# Patient Record
Sex: Female | Born: 1955
Health system: Southern US, Community
[De-identification: ages and names within clinical notes are randomized; demographics above are authoritative.]

## PROBLEM LIST (undated history)

## (undated) DIAGNOSIS — E7800 Pure hypercholesterolemia, unspecified: Secondary | ICD-10-CM

## (undated) DIAGNOSIS — Z860100 Personal history of colon polyps, unspecified: Secondary | ICD-10-CM

## (undated) DIAGNOSIS — K589 Irritable bowel syndrome without diarrhea: Secondary | ICD-10-CM

## (undated) DIAGNOSIS — M549 Dorsalgia, unspecified: Secondary | ICD-10-CM

## (undated) DIAGNOSIS — R42 Dizziness and giddiness: Secondary | ICD-10-CM

## (undated) DIAGNOSIS — K648 Other hemorrhoids: Secondary | ICD-10-CM

## (undated) DIAGNOSIS — L680 Hirsutism: Secondary | ICD-10-CM

## (undated) DIAGNOSIS — K219 Gastro-esophageal reflux disease without esophagitis: Secondary | ICD-10-CM

## (undated) DIAGNOSIS — N200 Calculus of kidney: Secondary | ICD-10-CM

## (undated) DIAGNOSIS — R7989 Other specified abnormal findings of blood chemistry: Secondary | ICD-10-CM

## (undated) DIAGNOSIS — E559 Vitamin D deficiency, unspecified: Secondary | ICD-10-CM

## (undated) DIAGNOSIS — K635 Polyp of colon: Secondary | ICD-10-CM

## (undated) DIAGNOSIS — Z8742 Personal history of other diseases of the female genital tract: Secondary | ICD-10-CM

## (undated) DIAGNOSIS — N281 Cyst of kidney, acquired: Secondary | ICD-10-CM

## (undated) DIAGNOSIS — E079 Disorder of thyroid, unspecified: Secondary | ICD-10-CM

## (undated) DIAGNOSIS — G4730 Sleep apnea, unspecified: Secondary | ICD-10-CM

## (undated) DIAGNOSIS — R946 Abnormal results of thyroid function studies: Secondary | ICD-10-CM

## (undated) DIAGNOSIS — S92909A Unspecified fracture of unspecified foot, initial encounter for closed fracture: Secondary | ICD-10-CM

## (undated) DIAGNOSIS — I8390 Asymptomatic varicose veins of unspecified lower extremity: Secondary | ICD-10-CM

## (undated) DIAGNOSIS — R7309 Other abnormal glucose: Secondary | ICD-10-CM

## (undated) DIAGNOSIS — M1990 Unspecified osteoarthritis, unspecified site: Secondary | ICD-10-CM

## (undated) DIAGNOSIS — R319 Hematuria, unspecified: Secondary | ICD-10-CM

## (undated) DIAGNOSIS — G43909 Migraine, unspecified, not intractable, without status migrainosus: Secondary | ICD-10-CM

## (undated) DIAGNOSIS — Z9109 Other allergy status, other than to drugs and biological substances: Secondary | ICD-10-CM

## (undated) DIAGNOSIS — L719 Rosacea, unspecified: Secondary | ICD-10-CM

## (undated) DIAGNOSIS — K5900 Constipation, unspecified: Secondary | ICD-10-CM

## (undated) DIAGNOSIS — K5790 Diverticulosis of intestine, part unspecified, without perforation or abscess without bleeding: Secondary | ICD-10-CM

## (undated) DIAGNOSIS — E669 Obesity, unspecified: Secondary | ICD-10-CM

## (undated) HISTORY — DX: Other hemorrhoids: K64.8

## (undated) HISTORY — DX: Other allergy status, other than to drugs and biological substances: Z91.09

## (undated) HISTORY — DX: Personal history of other diseases of the female genital tract: Z87.42

## (undated) HISTORY — DX: Diverticulosis of intestine, part unspecified, without perforation or abscess without bleeding: K57.90

## (undated) HISTORY — DX: Personal history of colonic polyps: Z86.010

## (undated) HISTORY — DX: Other abnormal glucose: R73.09

## (undated) HISTORY — DX: Polyp of colon: K63.5

## (undated) HISTORY — DX: Irritable bowel syndrome, unspecified: K58.9

## (undated) HISTORY — PX: HEMATOMA EVACUATION: SHX5118

## (undated) HISTORY — DX: Calculus of kidney: N20.0

## (undated) HISTORY — DX: Rosacea, unspecified: L71.9

## (undated) HISTORY — DX: Migraine, unspecified, not intractable, without status migrainosus: G43.909

## (undated) HISTORY — DX: Constipation, unspecified: K59.00

## (undated) HISTORY — DX: Abnormal results of thyroid function studies: R94.6

## (undated) HISTORY — DX: Asymptomatic varicose veins of unspecified lower extremity: I83.90

## (undated) HISTORY — DX: Gastro-esophageal reflux disease without esophagitis: K21.9

## (undated) HISTORY — DX: Obesity, unspecified: E66.9

## (undated) HISTORY — DX: Dorsalgia, unspecified: M54.9

## (undated) HISTORY — DX: Other specified abnormal findings of blood chemistry: R79.89

## (undated) HISTORY — DX: Dizziness and giddiness: R42

## (undated) HISTORY — DX: Unspecified osteoarthritis, unspecified site: M19.90

## (undated) HISTORY — DX: Sleep apnea, unspecified: G47.30

## (undated) HISTORY — PX: ABDOMINAL HYSTERECTOMY: SHX81

## (undated) HISTORY — DX: Unspecified fracture of unspecified foot, initial encounter for closed fracture: S92.909A

## (undated) HISTORY — DX: Cyst of kidney, acquired: N28.1

## (undated) HISTORY — DX: Vitamin D deficiency, unspecified: E55.9

## (undated) HISTORY — DX: Personal history of colon polyps, unspecified: Z86.0100

## (undated) HISTORY — DX: Hematuria, unspecified: R31.9

## (undated) HISTORY — DX: Pure hypercholesterolemia, unspecified: E78.00

## (undated) HISTORY — DX: Hirsutism: L68.0

---

## 1977-04-25 HISTORY — PX: ABDOMINAL EXPLORATION SURGERY: SHX538

## 1978-04-25 HISTORY — PX: APPENDECTOMY: SHX54

## 1978-04-25 HISTORY — PX: TUBAL LIGATION: SHX77

## 1983-04-26 HISTORY — PX: PARTIAL HYSTERECTOMY: SHX80

## 2004-08-18 ENCOUNTER — Ambulatory Visit: Payer: Self-pay | Admitting: Otolaryngology

## 2006-07-05 ENCOUNTER — Ambulatory Visit: Payer: Self-pay | Admitting: Unknown Physician Specialty

## 2007-12-03 ENCOUNTER — Emergency Department: Payer: Self-pay | Admitting: Emergency Medicine

## 2007-12-03 ENCOUNTER — Other Ambulatory Visit: Payer: Self-pay

## 2009-04-30 ENCOUNTER — Ambulatory Visit: Payer: Self-pay | Admitting: Family Medicine

## 2009-05-15 LAB — HM MAMMOGRAPHY: HM Mammogram: NORMAL

## 2009-06-21 ENCOUNTER — Emergency Department: Payer: Self-pay | Admitting: Emergency Medicine

## 2010-07-22 ENCOUNTER — Ambulatory Visit: Payer: Self-pay | Admitting: Family Medicine

## 2012-02-17 ENCOUNTER — Other Ambulatory Visit: Payer: Self-pay

## 2012-02-17 ENCOUNTER — Telehealth: Payer: Self-pay | Admitting: Radiology

## 2012-02-17 ENCOUNTER — Ambulatory Visit (INDEPENDENT_AMBULATORY_CARE_PROVIDER_SITE_OTHER): Payer: BC Managed Care – PPO | Admitting: Family Medicine

## 2012-02-17 ENCOUNTER — Ambulatory Visit
Admission: RE | Admit: 2012-02-17 | Discharge: 2012-02-17 | Disposition: A | Discharge status: Home / Self Care | Payer: Self-pay | Source: Ambulatory Visit | Attending: Family Medicine | Admitting: Family Medicine

## 2012-02-17 VITALS — BP 112/70 | HR 73 | Temp 98.1°F | Resp 16 | Ht 60.0 in | Wt 145.0 lb

## 2012-02-17 DIAGNOSIS — G43909 Migraine, unspecified, not intractable, without status migrainosus: Secondary | ICD-10-CM

## 2012-02-17 DIAGNOSIS — F411 Generalized anxiety disorder: Secondary | ICD-10-CM

## 2012-02-17 DIAGNOSIS — R51 Headache: Secondary | ICD-10-CM

## 2012-02-17 DIAGNOSIS — R079 Chest pain, unspecified: Secondary | ICD-10-CM

## 2012-02-17 DIAGNOSIS — G4700 Insomnia, unspecified: Secondary | ICD-10-CM

## 2012-02-17 DIAGNOSIS — F419 Anxiety disorder, unspecified: Secondary | ICD-10-CM

## 2012-02-17 DIAGNOSIS — E7800 Pure hypercholesterolemia, unspecified: Secondary | ICD-10-CM

## 2012-02-17 LAB — POCT CBC
Granulocyte percent: 67.1 %G (ref 37–80)
MID (cbc): 0.5 (ref 0–0.9)
MPV: 9.9 fL (ref 0–99.8)
POC MID %: 6.1 %M (ref 0–12)
Platelet Count, POC: 299 10*3/uL (ref 142–424)
RBC: 5.15 M/uL (ref 4.04–5.48)

## 2012-02-17 LAB — POCT SEDIMENTATION RATE: POCT SED RATE: 13 mm/hr (ref 0–22)

## 2012-02-17 MED ORDER — LORAZEPAM 0.5 MG PO TABS: 0.5000 mg | ORAL_TABLET | Freq: Three times a day (TID) | ORAL | Status: DC

## 2012-02-17 MED ORDER — PREDNISONE 20 MG PO TABS: ORAL_TABLET | ORAL | Status: DC

## 2012-02-17 MED ORDER — AMITRIPTYLINE HCL 25 MG PO TABS: 25.0000 mg | ORAL_TABLET | Freq: Every day | ORAL | Status: DC

## 2012-02-17 NOTE — Telephone Encounter (Signed)
Patient could not do the MRI because she is [place], have spoken to Dr Katrinka Blazing, then to patient. The patient advised to go to ER for worsening of headache, dizziness, gait trouble or vomiting. She has Rx for sedation, and she will have her husband drive her for the scan. I have called [facility], to see if they can RS the scan.

## 2012-02-17 NOTE — Telephone Encounter (Signed)
Sorry, error only the MRI was authorized, not the MRA.

## 2012-02-17 NOTE — Telephone Encounter (Signed)
I have been advised she can go Monday at 130, she needs to be there at 12:45.  Is at [facility] location. I have called patient to advise.

## 2012-02-17 NOTE — Progress Notes (Signed)
[address]   [place], [place]  [address]   [phone]  Subjective:    [name], female    DOB: 1955-09-22, 56 y.o.   MRN: [id number]  HPIThis 56 y.o. female presents for evaluation of the following:  1.  Headaches, vertigo:  Diagnosed with vertigo in past; felt secondary to migraine; diagnosed by [facility].  Never evaluated by neurologist or headache specialist; s/p MRI with Dr. Jenne Campus in 2007.  Had another MRI brain in 2009 normal.  Having horrible headaches; applies pressure to forehead; eyes hurt; feels like marble behind R eye.  Getting a little dizziness.  No horrible vertigo with headaches; having some vertigo with laying supine; no horrible vertigo attacks.  12/10 R eye pain.  Headaches way worse.  Deep eye pain.  No pain with moving eyes.  Headache is constant but severity waxes and wanes; intensity worsens with increasing stress an anxiety.  No problems with balance, unsteady gait.  No n/t.  Normal b/b function.  No confusion; no neck pain.  Not sleeping; major stressors since 04/2011.  +jaw pain; no grinding teeth or clenching teeth. No pain with chewing.  Onset of headaches in 11/2011 with mother's behavior worsened with dementia.  Also took Tramadol, Tylenol, ASA without improvement.  Taking ASA 325mg  daily without improvement in headache.  Intermittent nausea; +phonophobia.  Worst headache of life.  2.  Photophobia: can occur at any time; eyes stinging.  Not like sinus headache.  Light sensitivity R eye.  No tearing of eye.  No drainage.  Applying artificial tears without improvement.  Blurred vision with severe eye pain.    3.  Insomnia:  Sleeping only 4-5 hours per night.  Taking Halcion nightly; worked for 3-4 weeks; taking one Halcion at bedtime.   4.  Anxety:  Took husband's Xanax one tablet without any effect.  Took Xanax with Halcion; slept four hours.  Having panic attacks; daughter suggested Ativan which works for daughter.   Father committed suicide in 08/2011.   Mother diagnosed with dementia 04/2011.  Husband had AMI 08/2011.  Husband underwent stenting cardiac in  09/2011.  Mother had CVA in 11/2011.  Husband readmitted to hospital for recurrent chest pain; s/p repeat cardiac catheterization 11/2011.  Son got married in 01/2012; involved in wedding planning.  Additional training for Medicare enrollment and ObamaCare in 01/2012.    5.  Chest pain: occurs sporadically; +chest tightness; non-exertional.  Known hyperlipidemia, obesity, glucose intolerance.     Review of Systems  Constitutional: Positive for fatigue. Negative for fever, chills and diaphoresis.  HENT: Negative for nosebleeds, congestion, rhinorrhea, sneezing and postnasal drip.   Eyes: Positive for photophobia, pain and visual disturbance. Negative for discharge, redness and itching.  Respiratory: Positive for chest tightness. Negative for shortness of breath, wheezing and stridor.   Cardiovascular: Positive for chest pain. Negative for palpitations and leg swelling.  Neurological: Positive for dizziness and headaches. Negative for tremors, seizures, syncope, facial asymmetry, speech difficulty, weakness, light-headedness and numbness.  Hematological: Negative for adenopathy.  Psychiatric/Behavioral: Positive for disturbed wake/sleep cycle. Negative for suicidal ideas, hallucinations, behavioral problems, confusion, self-injury, dysphoric mood, decreased concentration and agitation. The patient is nervous/anxious. The patient is not hyperactive.         Past Medical History  Diagnosis Date  . Allergy   . Anemia   . IBS (irritable bowel syndrome)   . Hyperlipidemia     Past Surgical History  Procedure Date  . Appendectomy   .  Tubal ligation     Prior to Admission medications   Medication Sig Start Date End Date Taking? Authorizing Provider  amitriptyline (ELAVIL) 25 MG tablet Take 1 tablet (25 mg total) by mouth at bedtime. 02/17/12   Ethelda Chick, MD  atorvastatin (LIPITOR) 10 MG  tablet Take 10 mg by mouth daily.   Yes Historical Provider, MD  cholecalciferol (VITAMIN D) 1000 UNITS tablet Take 5,000 Units by mouth daily.   Yes Historical Provider, MD  clidinium-chlordiazePOXIDE (LIBRAX) 2.5-5 MG per capsule Take 1 capsule by mouth 3 (three) times daily as needed.   Yes Historical Provider, MD  fexofenadine (ALLEGRA) 180 MG tablet Take 180 mg by mouth daily.   Yes Historical Provider, MD  fluticasone (VERAMYST) 27.5 MCG/SPRAY nasal spray Place 2 sprays into the nose daily.   Yes Historical Provider, MD  LORazepam (ATIVAN) 0.5 MG tablet Take 1 tablet (0.5 mg total) by mouth every 8 (eight) hours. 02/17/12   Ethelda Chick, MD  traMADol (ULTRAM) 50 MG tablet Take 50 mg by mouth as needed.   Yes Historical Provider, MD  triazolam (HALCION) 0.25 MG tablet Take 0.25 mg by mouth at bedtime as needed.   Yes Historical Provider, MD    No Known Allergies  History   Social History  . Marital Status: Married    Spouse Name: N/A    Number of Children: N/A  . Years of Education: N/A   Occupational History  . Not on file.   Social History Main Topics  . Smoking status: Never Smoker   . Smokeless tobacco: Not on file  . Alcohol Use: Yes  . Drug Use: No  . Sexually Active: Not on file   Other Topics Concern  . Not on file   Social History Narrative  . No narrative on file    Family History  Problem Relation Age of Onset  . Diabetes Mother   . COPD Mother   . Heart disease Mother   . Diabetes Sister   . Alcohol abuse Brother     Objective:   Physical Exam  Constitutional: She is oriented to person, place, and time. She appears well-developed and well-nourished. She appears distressed.       SUNGLASSES IN PLACE; LIGHTS OFF IN EXAMINATION ROOM.    HENT:  Head: Normocephalic and atraumatic.  Right Ear: External ear normal.  Left Ear: External ear normal.  Nose: Nose normal.  Mouth/Throat: Oropharynx is clear and moist.  Eyes: Conjunctivae normal and EOM are  normal. Pupils are equal, round, and reactive to light.  Neck: Normal range of motion. Neck supple. No JVD present. No thyromegaly present.  Cardiovascular: Normal rate, regular rhythm, normal heart sounds and intact distal pulses.  Exam reveals no gallop and no friction rub.   No murmur heard. Pulmonary/Chest: Effort normal and breath sounds normal. No respiratory distress.  Abdominal: Soft. Bowel sounds are normal. She exhibits no distension. There is no tenderness. There is no rebound and no guarding.  Musculoskeletal:       Cervical back: Normal. She exhibits normal range of motion and no tenderness.  Lymphadenopathy:    She has no cervical adenopathy.  Neurological: She is alert and oriented to person, place, and time. She has normal reflexes. No cranial nerve deficit. She exhibits normal muscle tone. Coordination normal.  Skin: Skin is warm and dry. She is not diaphoretic. No erythema.  Psychiatric: She has a normal mood and affect. Her behavior is normal. Judgment and thought content normal.  EKG: NSR; NO ACUTE CHANGES.   Results for orders placed in visit on 02/17/12  POCT CBC      Component Value Range   WBC 7.7  4.6 - 10.2 K/uL   Lymph, poc 2.1  0.6 - 3.4   POC LYMPH PERCENT 26.8  10 - 50 %L   MID (cbc) 0.5  0 - 0.9   POC MID % 6.1  0 - 12 %M   POC Granulocyte 5.2  2 - 6.9   Granulocyte percent 67.1  37 - 80 %G   RBC 5.15  4.04 - 5.48 M/uL   Hemoglobin 15.3  12.2 - 16.2 g/dL   HCT, POC 09.8 (*) 11.9 - 47.9 %   MCV 93.5  80 - 97 fL   MCH, POC 29.7  27 - 31.2 pg   MCHC 31.7 (*) 31.8 - 35.4 g/dL   RDW, POC 14.7     Platelet Count, POC 299  142 - 424 K/uL   MPV 9.9  0 - 99.8 fL        Assessment & Plan:   1. Anxiety  LORazepam (ATIVAN) 0.5 MG tablet  2. Insomnia  LORazepam (ATIVAN) 0.5 MG tablet  3. Migraine  amitriptyline (ELAVIL) 25 MG tablet  4. Chest pain  EKG 12-Lead, Comprehensive metabolic panel  5. Headache  POCT CBC, POCT SEDIMENTATION RATE, Comprehensive  metabolic panel, MR MRA Head/Brain Wo Cm  6.  Photophobia. 7. Hyperlipidemia   1.  Headache:  New.  Worst headache of life for past two months; obtain MRI/MRA to evaluate for aneurysm.  Normal neurological exam. Also obtain ESR to evaluate for temporal arteritis yet young for onset. 2. Migraine Headaches: Chronic with recent worsening headaches. Migraines previously diagnosed by ENT; associated with vertigo.  Obtain MRI brain to rule out secondary pathology.  Start Amitriptyline qhs for prophylaxis.   3.  Chest pain: New. Atypical; normal EKG.  Consistent with stress reaction.  To ED for acute worsening.  If persists after appropriate treatment of anxiety, refer to cardiology. 4.  Anxiety: New. Multiple stressors in past ten months.  Pt declined SSRI; rx for Ativan 0.5mg  bid to tid PRN.  Treat insomnia and expect anxiety to improve.  Close follow-up.   5.  Photophobia:  New. Associated with severe headache.  Likely migraine etiology yet severe at this time.  Recommend evaluation by ophthalmology.  Obtain MRI/MRA brain today. 6. Hyperlipidemia: uncontrolled; tolerating Lipitor without side effects; obtain labs.  Meds ordered this encounter  Medications  . fexofenadine (ALLEGRA) 180 MG tablet    Sig: Take 180 mg by mouth daily.  . cholecalciferol (VITAMIN D) 1000 UNITS tablet    Sig: Take 5,000 Units by mouth daily.  Marland Kitchen atorvastatin (LIPITOR) 10 MG tablet    Sig: Take 10 mg by mouth daily.  Marland Kitchen DISCONTD: triazolam (HALCION) 0.25 MG tablet    Sig: Take 0.25 mg by mouth at bedtime as needed.  . fluticasone (VERAMYST) 27.5 MCG/SPRAY nasal spray    Sig: Place 2 sprays into the nose daily.  . clidinium-chlordiazePOXIDE (LIBRAX) 2.5-5 MG per capsule    Sig: Take 1 capsule by mouth 3 (three) times daily as needed.  . traMADol (ULTRAM) 50 MG tablet    Sig: Take 50 mg by mouth as needed.  Marland Kitchen DISCONTD: LORazepam (ATIVAN) 0.5 MG tablet    Sig: Take 1 tablet (0.5 mg total) by mouth every 8 (eight) hours.      Dispense:  60 tablet    Refill:  1  . DISCONTD: amitriptyline (ELAVIL) 25 MG tablet    Sig: Take 1 tablet (25 mg total) by mouth at bedtime.    Dispense:  30 tablet    Refill:  5  . predniSONE (DELTASONE) 20 MG tablet    Sig: Take 3 today, take 2 on day 2,3,4,5,6, take 1 on day 7,8,9,10,11    Dispense:  18 tablet    Refill:  0

## 2012-02-17 NOTE — Telephone Encounter (Signed)
I spoke to [facility] today about MRI / and MRA since they are both ordered same day, Dr Katrinka Blazing also had to speak to the physician. He authorized both scans the auth # [id number]. I called [facility], to get them done today, they are going to do the scan at [facility]. Location, she is to go now. Thanks. Amy

## 2012-02-17 NOTE — Patient Instructions (Addendum)
Go now/ today for MRI scans, [facility] on [address]. The address is [address]  [place].

## 2012-02-18 LAB — COMPREHENSIVE METABOLIC PANEL
ALT: 32 U/L (ref 0–35)
AST: 20 U/L (ref 0–37)
Albumin: 4.8 g/dL (ref 3.5–5.2)
Calcium: 9.7 mg/dL (ref 8.4–10.5)
Chloride: 107 mEq/L (ref 96–112)
Potassium: 4.3 mEq/L (ref 3.5–5.3)
Total Protein: 7.6 g/dL (ref 6.0–8.3)

## 2012-02-18 LAB — LIPID PANEL: LDL Cholesterol: 118 mg/dL — ABNORMAL HIGH (ref 0–99)

## 2012-02-20 ENCOUNTER — Other Ambulatory Visit: Payer: Self-pay | Admitting: Family Medicine

## 2012-02-20 ENCOUNTER — Ambulatory Visit
Admission: RE | Admit: 2012-02-20 | Discharge: 2012-02-20 | Disposition: A | Discharge status: Home / Self Care | Payer: Self-pay | Source: Ambulatory Visit | Attending: Family Medicine | Admitting: Family Medicine

## 2012-02-20 ENCOUNTER — Ambulatory Visit: Admission: RE | Admit: 2012-02-20 | Payer: Self-pay | Source: Ambulatory Visit

## 2012-02-20 DIAGNOSIS — R51 Headache: Secondary | ICD-10-CM

## 2012-02-21 ENCOUNTER — Telehealth: Payer: Self-pay

## 2012-02-21 NOTE — Telephone Encounter (Signed)
Call --- 1.  Ativan is to only be taken as needed and not scheduled three times daily. If she is needing Ativan three times daily and every day, she really needs to start medication for anxiety disorder such as Lexapro or Prozac.  Has she been taking Ativan two tablets three times daily since she was evaluated last week?  2.  Yes, I recommend evaluation by eye doctor.  3.  She needs follow-up with me in 2-3 weeks at 104.  Have her schedule appointment with me.  KMS

## 2012-02-21 NOTE — Telephone Encounter (Signed)
Pt called to ask what the results of her MRI was that was done yesterday - she was told to call us this morning. I advised pt that the radiology report stated normal results and that if Dr Katrinka Blazing had any further instr's for her we will call her back w/those. Pt stated she has some medication that she was given and will take that. Pt also wanted Dr Katrinka Blazing to know that the 1 tab of Ativan 0.5 mg is not helping and that she has to take two of them TID. Pt requests that a Rx for Ativan 1 mg TID be sent to her pharmacy, [facility] [address].   Pt also wants to ask Dr Katrinka Blazing if she needs to make an appt w/her eye doctor, and if she needs to f/up here. Dr Katrinka Blazing, please advise.

## 2012-02-22 NOTE — Telephone Encounter (Signed)
patient returned call notified and voiced understanding. She is not taking Ativan tid. Only as needed. She was also wanting to know what her lab results were. Please advise.

## 2012-02-22 NOTE — Telephone Encounter (Signed)
Left message for her to call me back. 

## 2012-02-27 NOTE — Telephone Encounter (Signed)
Lab results were called to pt on 02/25/12 - see notes under lab results.

## 2012-03-12 ENCOUNTER — Telehealth: Payer: Self-pay

## 2012-03-12 MED ORDER — LORAZEPAM 1 MG PO TABS: 1.0000 mg | ORAL_TABLET | Freq: Two times a day (BID) | ORAL | Status: DC | PRN

## 2012-03-12 NOTE — Telephone Encounter (Signed)
Ok to call in:  Ativan 1.0mg    1/2 to 1 po bid PRN  #60 2 refills.  KMS

## 2012-03-12 NOTE — Telephone Encounter (Signed)
PT WAS SEEN BY DR Katrinka Blazing AND HAD TO HAVE AN MRI DONE. SHE WOULD LIKE TO SPEAK WITH HER NURSE ABOUT THE VISIT AND ALSO ABOUT CHANGING THE DOSAGE OF HER MEDICATION. PLEASE CALL [phone]

## 2012-03-12 NOTE — Telephone Encounter (Signed)
Called in for her. Called patient to advise.  

## 2012-03-12 NOTE — Telephone Encounter (Signed)
I called patient back, she wants the Halcion increased she has been taking 2 at bedtime, as Dr Katrinka Blazing advised.  She has seen the eye Dr and she has monovision and photophobia, should be better when she gets glasses. She has d/c the Elavil. I have pended this rx, please sign if you want her to stay at this dose.

## 2012-03-13 ENCOUNTER — Telehealth: Payer: Self-pay | Admitting: Radiology

## 2012-03-13 NOTE — Telephone Encounter (Signed)
Please advise on Halcion, I sent message yesterday, patient wanted new Rx for this medication,at a higher dose but when you sent message back to me, you indicated to send in Ativan, so this was done, but patient does not want the Ativan, she wants Halcion at a higher dose. Please clarify.

## 2012-03-14 ENCOUNTER — Telehealth: Payer: Self-pay

## 2012-03-14 NOTE — Telephone Encounter (Signed)
Patient called very upset that her medication is wrong when she picked it up at the pharmacy. She picked up lorazepam instead of triazolam 0.5 mg. She requested to speak to amy or tamara and patient wishes to be contacted today immediately in reagards to medication mixup.

## 2012-03-15 MED ORDER — TRIAZOLAM 0.25 MG PO TABS: 0.5000 mg | ORAL_TABLET | Freq: Every evening | ORAL | Status: DC | PRN

## 2012-03-15 NOTE — Telephone Encounter (Signed)
Call ---my error in sending in Ativan/Lorazepam instead of Halcion.    Lorazepam and Halcion have same exact mechanism of action and are in same class of medications. Thus, I would like her to eventually get off of Halcion completely and only use Lorazepam for anxiety and insomnia.  I would recommend her using Lorazepam for insomnia instead of increasing Halcion dose.   Halcion is not indicated for long-term use; thus, I am reluctant to actually increase the dose.  How is she currently taking Lorezapam on an average week?  Is she needing Halcion nightly?  KMS

## 2012-03-15 NOTE — Telephone Encounter (Signed)
Yes, please call in:  Halcion 0.25mg  two po qhs #60 2 refills. KMS

## 2012-03-15 NOTE — Telephone Encounter (Signed)
New rx for Halcion to be sent to pharmacy.  Ativan does not work for patient and not taking.  Requesting Halcion 0.25mg  two po qhs.  KMS

## 2012-03-15 NOTE — Telephone Encounter (Signed)
Called in Rx and notified pt 

## 2012-03-15 NOTE — Telephone Encounter (Signed)
Dr Katrinka Blazing, pt reports that the Ativan does not work for her at all, no matter what dose she took so she has not been taking any Ativan. Pt reports she has been having to take two of the 0.25 Halcion Qhs to even get a little drowsy and then sleeps about 4 hrs. Pt requests RF of the Halcion for 2 Qhs #60. I discussed recommendation that Halcion not be used long term and advised pt she should RTC to discuss alternatives w/Dr Katrinka Blazing. Pt agreed and I transferred her to 104 to set up appt for January. Dr Katrinka Blazing, can you Rx the 1-2 Halcion Qhs until then?

## 2012-03-16 ENCOUNTER — Telehealth: Payer: Self-pay

## 2012-03-16 NOTE — Telephone Encounter (Signed)
Pt called and reported that after our conversation yesterday concerning that Halcion is not recommended for long term use, she decided to not take any last night. Now she is having withdrawal Sxs including a panic attack last night and now she has nausea and muscle tremors. After speaking w/Sarah and Lanora Manis, instr'd pt to take 1/2 tab now (she has been taking 2 tabs Qhs) and then take her usual 2 tabs tonight. Starting tomorrow she may slowly start tapering if desired and take 1 1/2 tabs for several days, then decrease to 1 tab for several days and then to 1/2 tab for several days. Advised pt to RTC or ER if Sxs of withdrawal don't improve and to come in to see Dr Katrinka Blazing as soon as possible for new plan for her insomnia. Pt will try to come see her on 12/1 when she is back in office. Dr Katrinka Blazing, routing to you for your info.

## 2012-03-19 NOTE — Telephone Encounter (Signed)
Agree with below noted advice.  No further action warranted at this time.  KMS

## 2012-03-31 NOTE — Progress Notes (Signed)
Reviewed and agree.

## 2012-04-25 DIAGNOSIS — K635 Polyp of colon: Secondary | ICD-10-CM

## 2012-04-25 HISTORY — DX: Polyp of colon: K63.5

## 2012-04-25 HISTORY — PX: COLONOSCOPY: SHX174

## 2012-05-14 ENCOUNTER — Ambulatory Visit: Payer: BC Managed Care – PPO | Admitting: Family Medicine

## 2012-05-22 ENCOUNTER — Encounter: Payer: Self-pay | Admitting: *Deleted

## 2012-05-23 ENCOUNTER — Ambulatory Visit: Payer: Self-pay | Admitting: Internal Medicine

## 2012-05-24 ENCOUNTER — Encounter: Payer: Self-pay | Admitting: Internal Medicine

## 2012-05-24 ENCOUNTER — Ambulatory Visit (INDEPENDENT_AMBULATORY_CARE_PROVIDER_SITE_OTHER): Payer: BC Managed Care – PPO | Admitting: Internal Medicine

## 2012-05-24 VITALS — BP 122/70 | HR 85 | Temp 98.5°F | Ht 60.0 in | Wt 154.0 lb

## 2012-05-24 DIAGNOSIS — N200 Calculus of kidney: Secondary | ICD-10-CM

## 2012-05-24 DIAGNOSIS — Z139 Encounter for screening, unspecified: Secondary | ICD-10-CM

## 2012-05-24 DIAGNOSIS — K589 Irritable bowel syndrome without diarrhea: Secondary | ICD-10-CM

## 2012-05-24 DIAGNOSIS — Z86010 Personal history of colonic polyps: Secondary | ICD-10-CM

## 2012-05-24 DIAGNOSIS — G43909 Migraine, unspecified, not intractable, without status migrainosus: Secondary | ICD-10-CM

## 2012-05-24 DIAGNOSIS — E7800 Pure hypercholesterolemia, unspecified: Secondary | ICD-10-CM

## 2012-05-24 DIAGNOSIS — R42 Dizziness and giddiness: Secondary | ICD-10-CM

## 2012-05-24 MED ORDER — ATORVASTATIN CALCIUM 10 MG PO TABS: 10.0000 mg | ORAL_TABLET | Freq: Every day | ORAL | Status: DC

## 2012-05-25 ENCOUNTER — Telehealth: Payer: Self-pay | Admitting: Internal Medicine

## 2012-05-25 ENCOUNTER — Encounter: Payer: Self-pay | Admitting: Internal Medicine

## 2012-05-25 DIAGNOSIS — N200 Calculus of kidney: Secondary | ICD-10-CM | POA: Insufficient documentation

## 2012-05-25 DIAGNOSIS — R42 Dizziness and giddiness: Secondary | ICD-10-CM | POA: Insufficient documentation

## 2012-05-25 DIAGNOSIS — E7800 Pure hypercholesterolemia, unspecified: Secondary | ICD-10-CM | POA: Insufficient documentation

## 2012-05-25 DIAGNOSIS — Z86010 Personal history of colonic polyps: Secondary | ICD-10-CM | POA: Insufficient documentation

## 2012-05-25 DIAGNOSIS — K589 Irritable bowel syndrome without diarrhea: Secondary | ICD-10-CM | POA: Insufficient documentation

## 2012-05-25 DIAGNOSIS — G43909 Migraine, unspecified, not intractable, without status migrainosus: Secondary | ICD-10-CM | POA: Insufficient documentation

## 2012-05-25 NOTE — Assessment & Plan Note (Signed)
Had extensive w/up.  Saw ENT.  S/p Epley maneuvers.  MRI negative. No symptoms currently.

## 2012-05-25 NOTE — Assessment & Plan Note (Signed)
Not an issue for her now.  Follow.  

## 2012-05-25 NOTE — Assessment & Plan Note (Signed)
Worked up by Dr Cope.  Has a stone.  Asymptomatic.  Follow.    
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
## 2012-05-25 NOTE — Assessment & Plan Note (Signed)
On lipitor.  Low cholesterol diet and exercise.  Check lipid panel and liver function.   

## 2012-05-25 NOTE — Assessment & Plan Note (Signed)
Colonoscopy as outlined above.  Will notify me - who to refer.  Due follow up.

## 2012-05-25 NOTE — Telephone Encounter (Signed)
Opened in error

## 2012-05-25 NOTE — Assessment & Plan Note (Signed)
Constipation.  Better.  Follow.

## 2012-05-29 ENCOUNTER — Other Ambulatory Visit: Payer: BC Managed Care – PPO

## 2012-06-13 ENCOUNTER — Ambulatory Visit: Payer: Self-pay | Admitting: Internal Medicine

## 2012-06-18 ENCOUNTER — Ambulatory Visit: Payer: Self-pay | Admitting: Internal Medicine

## 2012-07-05 ENCOUNTER — Encounter: Payer: Self-pay | Admitting: Internal Medicine

## 2012-07-06 ENCOUNTER — Encounter: Payer: Self-pay | Admitting: *Deleted

## 2012-07-25 ENCOUNTER — Other Ambulatory Visit: Payer: BC Managed Care – PPO

## 2012-08-20 ENCOUNTER — Other Ambulatory Visit (INDEPENDENT_AMBULATORY_CARE_PROVIDER_SITE_OTHER): Payer: BC Managed Care – PPO

## 2012-08-20 ENCOUNTER — Telehealth: Payer: Self-pay | Admitting: *Deleted

## 2012-08-20 DIAGNOSIS — E7800 Pure hypercholesterolemia, unspecified: Secondary | ICD-10-CM

## 2012-08-20 DIAGNOSIS — E559 Vitamin D deficiency, unspecified: Secondary | ICD-10-CM

## 2012-08-20 LAB — HEPATIC FUNCTION PANEL
ALT: 36 U/L — ABNORMAL HIGH (ref 0–35)
AST: 28 U/L (ref 0–37)
Albumin: 4.5 g/dL (ref 3.5–5.2)
Alkaline Phosphatase: 68 U/L (ref 39–117)
Bilirubin, Direct: 0.1 mg/dL (ref 0.0–0.3)
Total Protein: 7.8 g/dL (ref 6.0–8.3)

## 2012-08-20 LAB — LIPID PANEL
Cholesterol: 161 mg/dL (ref 0–200)
Triglycerides: 91 mg/dL (ref 0.0–149.0)

## 2012-08-20 NOTE — Addendum Note (Signed)
Addended by: Charm Barges on: 08/20/2012 01:00 PM   Modules accepted: Orders

## 2012-08-20 NOTE — Telephone Encounter (Signed)
Pt came in for labs and was wanting to get done a TSH ?

## 2012-08-20 NOTE — Telephone Encounter (Signed)
I placed order for thyroid function.

## 2012-08-21 ENCOUNTER — Other Ambulatory Visit: Payer: BC Managed Care – PPO

## 2012-08-21 ENCOUNTER — Encounter: Payer: Self-pay | Admitting: *Deleted

## 2012-08-30 ENCOUNTER — Encounter: Payer: BC Managed Care – PPO | Admitting: Internal Medicine

## 2012-09-05 ENCOUNTER — Ambulatory Visit (INDEPENDENT_AMBULATORY_CARE_PROVIDER_SITE_OTHER): Payer: BC Managed Care – PPO | Admitting: Internal Medicine

## 2012-09-05 ENCOUNTER — Encounter: Payer: Self-pay | Admitting: Internal Medicine

## 2012-09-05 VITALS — BP 120/70 | HR 92 | Temp 98.6°F | Ht 60.25 in | Wt 160.2 lb

## 2012-09-05 DIAGNOSIS — R42 Dizziness and giddiness: Secondary | ICD-10-CM

## 2012-09-05 DIAGNOSIS — Z1211 Encounter for screening for malignant neoplasm of colon: Secondary | ICD-10-CM

## 2012-09-05 DIAGNOSIS — E7800 Pure hypercholesterolemia, unspecified: Secondary | ICD-10-CM

## 2012-09-05 DIAGNOSIS — K589 Irritable bowel syndrome without diarrhea: Secondary | ICD-10-CM

## 2012-09-05 DIAGNOSIS — R7989 Other specified abnormal findings of blood chemistry: Secondary | ICD-10-CM

## 2012-09-05 DIAGNOSIS — Z0000 Encounter for general adult medical examination without abnormal findings: Secondary | ICD-10-CM

## 2012-09-05 DIAGNOSIS — N200 Calculus of kidney: Secondary | ICD-10-CM

## 2012-09-05 DIAGNOSIS — N289 Disorder of kidney and ureter, unspecified: Secondary | ICD-10-CM

## 2012-09-05 DIAGNOSIS — Z860100 Personal history of colon polyps, unspecified: Secondary | ICD-10-CM

## 2012-09-05 DIAGNOSIS — N898 Other specified noninflammatory disorders of vagina: Secondary | ICD-10-CM

## 2012-09-05 DIAGNOSIS — M2550 Pain in unspecified joint: Secondary | ICD-10-CM

## 2012-09-05 DIAGNOSIS — N2889 Other specified disorders of kidney and ureter: Secondary | ICD-10-CM

## 2012-09-05 DIAGNOSIS — R079 Chest pain, unspecified: Secondary | ICD-10-CM

## 2012-09-05 DIAGNOSIS — N9489 Other specified conditions associated with female genital organs and menstrual cycle: Secondary | ICD-10-CM

## 2012-09-05 DIAGNOSIS — G43909 Migraine, unspecified, not intractable, without status migrainosus: Secondary | ICD-10-CM

## 2012-09-05 LAB — FOLLICLE STIMULATING HORMONE: FSH: 52.7 m[IU]/mL

## 2012-09-05 LAB — C-REACTIVE PROTEIN: CRP: 0.6 mg/dL (ref 0.5–20.0)

## 2012-09-05 MED ORDER — ESTRADIOL 0.1 MG/GM VA CREA: TOPICAL_CREAM | VAGINAL | Status: DC

## 2012-09-07 ENCOUNTER — Telehealth: Payer: Self-pay | Admitting: *Deleted

## 2012-09-07 ENCOUNTER — Encounter: Payer: Self-pay | Admitting: Internal Medicine

## 2012-09-07 NOTE — Telephone Encounter (Signed)
Left detailed v/m with Dr Lorin Picket response to Topamax

## 2012-09-07 NOTE — Telephone Encounter (Signed)
Pt aware of lab results, & wanted to know about starting the Topamax (migraine medicine) as discussed at last visit. Please advise

## 2012-09-07 NOTE — Telephone Encounter (Signed)
Given her history of kidney stones, I would like to hold on topamax.  Given the daily nature and negative MRI with no clear answer- I would like for her to refer her to neurology for evaluation and treatment recommendations.  Let me know and I will place order for referral.

## 2012-09-09 ENCOUNTER — Encounter: Payer: Self-pay | Admitting: Internal Medicine

## 2012-09-10 ENCOUNTER — Encounter: Payer: Self-pay | Admitting: Internal Medicine

## 2012-09-10 DIAGNOSIS — N2889 Other specified disorders of kidney and ureter: Secondary | ICD-10-CM | POA: Insufficient documentation

## 2012-09-10 NOTE — Assessment & Plan Note (Signed)
Constipation.  Better.  Follow.

## 2012-09-10 NOTE — Assessment & Plan Note (Signed)
Had extensive w/up.  Saw ENT.  S/p Epley maneuvers.  MRI negative.

## 2012-09-10 NOTE — Assessment & Plan Note (Signed)
Headache as outlined.  Treat allergies.  Check ESR.  Discussed eye exam.  May need neurology referral.  MRI previously - reported negative.  Would hold starting topamax given history of kidney stones.  Follow.
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
## 2012-09-10 NOTE — Assessment & Plan Note (Signed)
Followed by Dr Achilles Dunk.  Obtain records.

## 2012-09-10 NOTE — Assessment & Plan Note (Signed)
Worked up by Dr Cope.  Has a stone.  Asymptomatic.  Follow.    

## 2012-09-10 NOTE — Assessment & Plan Note (Signed)
On lipitor.  Low cholesterol diet and exercise.   Follow lipid panel and liver function.   

## 2012-09-10 NOTE — Assessment & Plan Note (Signed)
Colonoscopy as outlined above.  Due follow up colonoscopy.  Refer to GI.  Cardiac evaluation first.

## 2012-09-27 ENCOUNTER — Other Ambulatory Visit: Payer: BC Managed Care – PPO | Admitting: Cardiovascular Disease

## 2012-09-27 ENCOUNTER — Other Ambulatory Visit (INDEPENDENT_AMBULATORY_CARE_PROVIDER_SITE_OTHER): Payer: BC Managed Care – PPO

## 2012-09-27 DIAGNOSIS — R079 Chest pain, unspecified: Secondary | ICD-10-CM

## 2012-10-02 ENCOUNTER — Encounter: Payer: Self-pay | Admitting: Cardiovascular Disease

## 2012-10-02 ENCOUNTER — Telehealth: Payer: Self-pay | Admitting: *Deleted

## 2012-10-02 NOTE — Telephone Encounter (Signed)
Patient calling requesting results from stress echo.

## 2012-10-02 NOTE — Telephone Encounter (Signed)
Pt notified of stress test results (negative) via my chart.

## 2012-10-02 NOTE — Telephone Encounter (Signed)
See below

## 2012-10-23 ENCOUNTER — Ambulatory Visit: Payer: BC Managed Care – PPO | Admitting: Internal Medicine

## 2012-12-18 ENCOUNTER — Other Ambulatory Visit: Payer: Self-pay | Admitting: *Deleted

## 2012-12-18 MED ORDER — ATORVASTATIN CALCIUM 10 MG PO TABS: 10.0000 mg | ORAL_TABLET | Freq: Every day | ORAL | Status: DC

## 2012-12-27 ENCOUNTER — Other Ambulatory Visit: Payer: Self-pay | Admitting: Internal Medicine

## 2012-12-27 NOTE — Telephone Encounter (Signed)
Ok to refill? Last visit 09/05/12.

## 2012-12-28 NOTE — Telephone Encounter (Signed)
Please clarify with pt how often she takes this medications.  Then can decide about number needed for refills.

## 2012-12-28 NOTE — Telephone Encounter (Signed)
Usually gets a 90 day & takes them TID when you are having a spell

## 2012-12-29 NOTE — Telephone Encounter (Signed)
Refilled librax #90 with no refills.

## 2013-01-19 ENCOUNTER — Ambulatory Visit: Payer: Self-pay | Admitting: Internal Medicine

## 2013-01-19 ENCOUNTER — Other Ambulatory Visit: Payer: Self-pay | Admitting: Internal Medicine

## 2013-01-19 LAB — URINALYSIS, COMPLETE
Glucose,UR: NEGATIVE mg/dL (ref 0–75)
Ketone: NEGATIVE
Nitrite: NEGATIVE

## 2013-01-21 LAB — URINE CULTURE

## 2013-01-21 NOTE — Telephone Encounter (Signed)
She needs a 30 minute appt

## 2013-01-21 NOTE — Telephone Encounter (Signed)
Okay to refill? Cancelled last appt on 10/23/12

## 2013-01-21 NOTE — Telephone Encounter (Signed)
Pt states that the current medication if working just fine and has been for many many years. I offered her an appt tomorrow, she could not come then. I informed patient that we will fill it this time but she will need an appt before she runs out.

## 2013-01-21 NOTE — Telephone Encounter (Signed)
I am ok to refill this x 1, but she needs a f/u with me.  Needs a regular f/u and I would like to discuss with her regarding possibly another medication to help with stress.  I can se her tomorrow at 2:45 if can come in.  Let me know if a problem

## 2013-01-22 ENCOUNTER — Ambulatory Visit: Payer: Self-pay | Admitting: Urology

## 2013-01-23 DIAGNOSIS — N23 Unspecified renal colic: Secondary | ICD-10-CM | POA: Insufficient documentation

## 2013-02-02 ENCOUNTER — Other Ambulatory Visit: Payer: Self-pay | Admitting: Internal Medicine

## 2013-02-10 ENCOUNTER — Other Ambulatory Visit: Payer: Self-pay | Admitting: Internal Medicine

## 2013-02-28 ENCOUNTER — Other Ambulatory Visit: Payer: Self-pay

## 2013-04-16 ENCOUNTER — Other Ambulatory Visit: Payer: Self-pay | Admitting: *Deleted

## 2013-04-16 MED ORDER — HYDROCORTISONE ACETATE 25 MG RE SUPP: 25.0000 mg | Freq: Two times a day (BID) | RECTAL | Status: DC

## 2013-04-24 ENCOUNTER — Other Ambulatory Visit: Payer: Self-pay | Admitting: *Deleted

## 2013-04-24 NOTE — Telephone Encounter (Signed)
How often does she use the suppositories.  Not sure how to write a 3 month supply for suppositories (because she should not be needing everyday).

## 2013-04-24 NOTE — Telephone Encounter (Signed)
Pt requests 3 month supply hydrocortisone supp?

## 2013-04-24 NOTE — Telephone Encounter (Signed)
Pharmacy Note:  Hydrocortisone AC 25 mg SUPP  Pt is request 58-months supply

## 2013-05-06 ENCOUNTER — Encounter: Payer: Self-pay | Admitting: *Deleted

## 2013-05-06 NOTE — Telephone Encounter (Signed)
Sent mychart message

## 2013-05-17 NOTE — Telephone Encounter (Signed)
Pt read mychart message & never responded (I've denied the medication assuming it is no longer needed)

## 2013-09-17 ENCOUNTER — Encounter: Payer: Self-pay | Admitting: Internal Medicine

## 2013-09-23 ENCOUNTER — Telehealth: Payer: Self-pay | Admitting: *Deleted

## 2013-09-23 ENCOUNTER — Other Ambulatory Visit: Payer: Self-pay | Admitting: Internal Medicine

## 2013-09-23 ENCOUNTER — Ambulatory Visit (INDEPENDENT_AMBULATORY_CARE_PROVIDER_SITE_OTHER): Payer: BC Managed Care – PPO | Admitting: Internal Medicine

## 2013-09-23 ENCOUNTER — Encounter: Payer: Self-pay | Admitting: Internal Medicine

## 2013-09-23 VITALS — BP 102/68 | HR 87 | Resp 16 | Ht 60.25 in | Wt 165.5 lb

## 2013-09-23 DIAGNOSIS — E7800 Pure hypercholesterolemia, unspecified: Secondary | ICD-10-CM

## 2013-09-23 DIAGNOSIS — N200 Calculus of kidney: Secondary | ICD-10-CM

## 2013-09-23 DIAGNOSIS — Z86010 Personal history of colonic polyps: Secondary | ICD-10-CM

## 2013-09-23 DIAGNOSIS — Z733 Stress, not elsewhere classified: Secondary | ICD-10-CM

## 2013-09-23 DIAGNOSIS — R42 Dizziness and giddiness: Secondary | ICD-10-CM

## 2013-09-23 DIAGNOSIS — F439 Reaction to severe stress, unspecified: Secondary | ICD-10-CM

## 2013-09-23 DIAGNOSIS — Z658 Other specified problems related to psychosocial circumstances: Secondary | ICD-10-CM | POA: Insufficient documentation

## 2013-09-23 DIAGNOSIS — N289 Disorder of kidney and ureter, unspecified: Secondary | ICD-10-CM

## 2013-09-23 DIAGNOSIS — K589 Irritable bowel syndrome without diarrhea: Secondary | ICD-10-CM

## 2013-09-23 DIAGNOSIS — R109 Unspecified abdominal pain: Secondary | ICD-10-CM

## 2013-09-23 DIAGNOSIS — K219 Gastro-esophageal reflux disease without esophagitis: Secondary | ICD-10-CM

## 2013-09-23 DIAGNOSIS — N2889 Other specified disorders of kidney and ureter: Secondary | ICD-10-CM

## 2013-09-23 DIAGNOSIS — G43909 Migraine, unspecified, not intractable, without status migrainosus: Secondary | ICD-10-CM

## 2013-09-23 MED ORDER — TRAZODONE HCL 50 MG PO TABS: 25.0000 mg | ORAL_TABLET | Freq: Every evening | ORAL | Status: DC | PRN
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
## 2013-09-23 NOTE — Assessment & Plan Note (Signed)
Has tried PPIs.  Did not help.  Gaviscon helps.  Already has an appt with GI scheduled for 10/03/13.  States due colonoscopy.  Consider EGD if persistent problems.

## 2013-09-23 NOTE — Assessment & Plan Note (Signed)
Followed by Dr Cope.  

## 2013-09-23 NOTE — Progress Notes (Signed)
Pre visit review using our clinic review tool, if applicable. No additional management support is needed unless otherwise documented below in the visit note. 

## 2013-09-23 NOTE — Assessment & Plan Note (Signed)
Worked up by Dr [name].  Has a stone.  Asymptomatic.  Follow.

## 2013-09-23 NOTE — Assessment & Plan Note (Signed)
Has IBS and spastic colon.  Takes Librax prn.

## 2013-09-23 NOTE — Telephone Encounter (Signed)
Orders placed for labs

## 2013-09-23 NOTE — Assessment & Plan Note (Signed)
Previous vertigo.  Has seen ENT.  S/p Epley maneuvers.  Recent flare.  Resolved now.  Follow.

## 2013-09-23 NOTE — Assessment & Plan Note (Signed)
On lipitor.  Low cholesterol diet and exercise.   Follow lipid panel and liver function.   

## 2013-09-23 NOTE — Assessment & Plan Note (Signed)
Increased stress with difficulty sleeping.  Treat with trazodone 50mg  as directed.  Follow closely.  Get her back in soon to reassess.

## 2013-09-23 NOTE — Assessment & Plan Note (Signed)
Pt with left side abdominal pain.  Constant.  Increased intensity at times.  Some decrease in pressure with a good bowel movement.  No triggers.  Increased pain on exam.  Check CT abdomen and pelvis.  Further w/up pending results.  Keep appt with GI.

## 2013-09-23 NOTE — Assessment & Plan Note (Signed)
Due colonoscopy.  Is scheduled to follow up in [place] for f/u colonoscopy.

## 2013-09-23 NOTE — Telephone Encounter (Signed)
Pt is coming in tomorrow what labs and dx?  

## 2013-09-23 NOTE — Assessment & Plan Note (Signed)
MRI previously - reported negative.  No problems with headaches reported today.

## 2013-09-24 ENCOUNTER — Other Ambulatory Visit (INDEPENDENT_AMBULATORY_CARE_PROVIDER_SITE_OTHER): Payer: BC Managed Care – PPO

## 2013-09-24 DIAGNOSIS — R42 Dizziness and giddiness: Secondary | ICD-10-CM

## 2013-09-24 DIAGNOSIS — R109 Unspecified abdominal pain: Secondary | ICD-10-CM

## 2013-09-24 DIAGNOSIS — N200 Calculus of kidney: Secondary | ICD-10-CM

## 2013-09-24 DIAGNOSIS — E7800 Pure hypercholesterolemia, unspecified: Secondary | ICD-10-CM

## 2013-09-24 LAB — CBC WITH DIFFERENTIAL/PLATELET
Basophils Absolute: 0 10*3/uL (ref 0.0–0.1)
Basophils Relative: 0.7 % (ref 0.0–3.0)
EOS ABS: 0.2 10*3/uL (ref 0.0–0.7)
Eosinophils Relative: 2.9 % (ref 0.0–5.0)
HEMATOCRIT: 42.6 % (ref 36.0–46.0)
Hemoglobin: 14.4 g/dL (ref 12.0–15.0)
LYMPHS ABS: 2 10*3/uL (ref 0.7–4.0)
Lymphocytes Relative: 34.8 % (ref 12.0–46.0)
MCHC: 33.9 g/dL (ref 30.0–36.0)
MCV: 89 fl (ref 78.0–100.0)
MONO ABS: 0.4 10*3/uL (ref 0.1–1.0)
Monocytes Relative: 7.2 % (ref 3.0–12.0)
NEUTROS PCT: 54.4 % (ref 43.0–77.0)
Neutro Abs: 3.1 10*3/uL (ref 1.4–7.7)
PLATELETS: 244 10*3/uL (ref 150.0–400.0)
RBC: 4.79 Mil/uL (ref 3.87–5.11)
RDW: 12.7 % (ref 11.5–15.5)
WBC: 5.7 10*3/uL (ref 4.0–10.5)

## 2013-09-24 LAB — LIPID PANEL
CHOLESTEROL: 161 mg/dL (ref 0–200)
HDL: 41.2 mg/dL (ref >39.00)
LDL CALC: 87 mg/dL (ref 0–99)
TRIGLYCERIDES: 163 mg/dL — AB (ref 0.0–149.0)
Total CHOL/HDL Ratio: 4
VLDL: 32.6 mg/dL (ref 0.0–40.0)

## 2013-09-24 LAB — URINALYSIS, ROUTINE W REFLEX MICROSCOPIC
Bilirubin Urine: NEGATIVE
HGB URINE DIPSTICK: NEGATIVE
Ketones, ur: NEGATIVE
Leukocytes, UA: NEGATIVE
Nitrite: NEGATIVE
RBC / HPF: NONE SEEN (ref 0–?)
Specific Gravity, Urine: >=1.03 — AB (ref 1.000–1.030)
Total Protein, Urine: NEGATIVE
UROBILINOGEN UA: 0.2 (ref 0.0–1.0)
Urine Glucose: NEGATIVE
pH: 6 (ref 5.0–8.0)

## 2013-09-24 LAB — BASIC METABOLIC PANEL
BUN: 15 mg/dL (ref 6–23)
CHLORIDE: 107 meq/L (ref 96–112)
CO2: 24 meq/L (ref 19–32)
Calcium: 9.3 mg/dL (ref 8.4–10.5)
Creatinine, Ser: 0.9 mg/dL (ref 0.4–1.2)
GFR: 71.39 mL/min (ref >60.00)
Glucose, Bld: 105 mg/dL — ABNORMAL HIGH (ref 70–99)
Potassium: 3.8 mEq/L (ref 3.5–5.1)
SODIUM: 140 meq/L (ref 135–145)

## 2013-09-24 LAB — HEPATIC FUNCTION PANEL
ALT: 40 U/L — ABNORMAL HIGH (ref 0–35)
AST: 28 U/L (ref 0–37)
Albumin: 4.3 g/dL (ref 3.5–5.2)
Alkaline Phosphatase: 66 U/L (ref 39–117)
Bilirubin, Direct: 0.1 mg/dL (ref 0.0–0.3)
TOTAL PROTEIN: 6.9 g/dL (ref 6.0–8.3)
Total Bilirubin: 0.9 mg/dL (ref 0.2–1.2)

## 2013-09-24 LAB — AMYLASE: AMYLASE: 42 U/L (ref 27–131)

## 2013-09-24 LAB — TSH: TSH: 5.25 u[IU]/mL — ABNORMAL HIGH (ref 0.35–4.50)

## 2013-09-24 LAB — LIPASE: LIPASE: 18 U/L (ref 11.0–59.0)

## 2013-09-24 MED ORDER — HYDROCORTISONE ACETATE 25 MG RE SUPP: 25.0000 mg | Freq: Two times a day (BID) | RECTAL | Status: DC

## 2013-09-24 MED ORDER — CILIDINIUM-CHLORDIAZEPOXIDE 2.5-5 MG PO CAPS: 1.0000 | ORAL_CAPSULE | Freq: Two times a day (BID) | ORAL | Status: DC | PRN

## 2013-09-24 NOTE — Telephone Encounter (Signed)
Refilled librax #60 with no refills and suppositories #12 with no refills.

## 2013-09-24 NOTE — Telephone Encounter (Signed)
Ok refill? 

## 2013-09-25 ENCOUNTER — Encounter: Payer: Self-pay | Admitting: Internal Medicine

## 2013-09-25 NOTE — Telephone Encounter (Signed)
Rx faxed to pharmacy  

## 2013-09-27 ENCOUNTER — Encounter (HOSPITAL_BASED_OUTPATIENT_CLINIC_OR_DEPARTMENT_OTHER): Payer: Self-pay | Admitting: Emergency Medicine

## 2013-09-27 ENCOUNTER — Emergency Department (HOSPITAL_BASED_OUTPATIENT_CLINIC_OR_DEPARTMENT_OTHER): Payer: BC Managed Care – PPO

## 2013-09-27 ENCOUNTER — Emergency Department (HOSPITAL_BASED_OUTPATIENT_CLINIC_OR_DEPARTMENT_OTHER)
Admission: EM | Admit: 2013-09-27 | Discharge: 2013-09-27 | Disposition: A | Discharge status: Home / Self Care | Payer: BC Managed Care – PPO | Attending: Emergency Medicine | Admitting: Emergency Medicine

## 2013-09-27 DIAGNOSIS — Z9851 Tubal ligation status: Secondary | ICD-10-CM | POA: Insufficient documentation

## 2013-09-27 DIAGNOSIS — Z8742 Personal history of other diseases of the female genital tract: Secondary | ICD-10-CM | POA: Insufficient documentation

## 2013-09-27 DIAGNOSIS — Z9889 Other specified postprocedural states: Secondary | ICD-10-CM | POA: Insufficient documentation

## 2013-09-27 DIAGNOSIS — E669 Obesity, unspecified: Secondary | ICD-10-CM | POA: Insufficient documentation

## 2013-09-27 DIAGNOSIS — Z7982 Long term (current) use of aspirin: Secondary | ICD-10-CM | POA: Insufficient documentation

## 2013-09-27 DIAGNOSIS — E7800 Pure hypercholesterolemia, unspecified: Secondary | ICD-10-CM | POA: Insufficient documentation

## 2013-09-27 DIAGNOSIS — Z8781 Personal history of (healed) traumatic fracture: Secondary | ICD-10-CM | POA: Insufficient documentation

## 2013-09-27 DIAGNOSIS — Z860100 Personal history of colon polyps, unspecified: Secondary | ICD-10-CM | POA: Insufficient documentation

## 2013-09-27 DIAGNOSIS — IMO0002 Reserved for concepts with insufficient information to code with codable children: Secondary | ICD-10-CM | POA: Insufficient documentation

## 2013-09-27 DIAGNOSIS — M1990 Unspecified osteoarthritis, unspecified site: Secondary | ICD-10-CM | POA: Insufficient documentation

## 2013-09-27 DIAGNOSIS — K5790 Diverticulosis of intestine, part unspecified, without perforation or abscess without bleeding: Secondary | ICD-10-CM

## 2013-09-27 DIAGNOSIS — K5730 Diverticulosis of large intestine without perforation or abscess without bleeding: Secondary | ICD-10-CM | POA: Insufficient documentation

## 2013-09-27 DIAGNOSIS — Z8679 Personal history of other diseases of the circulatory system: Secondary | ICD-10-CM | POA: Insufficient documentation

## 2013-09-27 DIAGNOSIS — Z791 Long term (current) use of non-steroidal anti-inflammatories (NSAID): Secondary | ICD-10-CM | POA: Insufficient documentation

## 2013-09-27 DIAGNOSIS — R109 Unspecified abdominal pain: Secondary | ICD-10-CM

## 2013-09-27 DIAGNOSIS — Z872 Personal history of diseases of the skin and subcutaneous tissue: Secondary | ICD-10-CM | POA: Insufficient documentation

## 2013-09-27 DIAGNOSIS — Z90710 Acquired absence of both cervix and uterus: Secondary | ICD-10-CM | POA: Insufficient documentation

## 2013-09-27 DIAGNOSIS — Z87448 Personal history of other diseases of urinary system: Secondary | ICD-10-CM | POA: Insufficient documentation

## 2013-09-27 DIAGNOSIS — E559 Vitamin D deficiency, unspecified: Secondary | ICD-10-CM | POA: Insufficient documentation

## 2013-09-27 DIAGNOSIS — Z87442 Personal history of urinary calculi: Secondary | ICD-10-CM | POA: Insufficient documentation

## 2013-09-27 DIAGNOSIS — G43909 Migraine, unspecified, not intractable, without status migrainosus: Secondary | ICD-10-CM | POA: Insufficient documentation

## 2013-09-27 HISTORY — DX: Disorder of thyroid, unspecified: E07.9

## 2013-09-27 LAB — COMPREHENSIVE METABOLIC PANEL
ALT: 38 U/L — AB (ref 0–35)
AST: 27 U/L (ref 0–37)
Albumin: 4.6 g/dL (ref 3.5–5.2)
Alkaline Phosphatase: 76 U/L (ref 39–117)
BUN: 16 mg/dL (ref 6–23)
CO2: 24 meq/L (ref 19–32)
CREATININE: 0.9 mg/dL (ref 0.50–1.10)
Calcium: 9.6 mg/dL (ref 8.4–10.5)
Chloride: 105 mEq/L (ref 96–112)
GFR calc Af Amer: 81 mL/min — ABNORMAL LOW (ref >90)
GFR, EST NON AFRICAN AMERICAN: 70 mL/min — AB (ref >90)
GLUCOSE: 111 mg/dL — AB (ref 70–99)
Potassium: 4.3 mEq/L (ref 3.7–5.3)
SODIUM: 143 meq/L (ref 137–147)
Total Bilirubin: 0.5 mg/dL (ref 0.3–1.2)
Total Protein: 7.7 g/dL (ref 6.0–8.3)

## 2013-09-27 LAB — CBC WITH DIFFERENTIAL/PLATELET
Basophils Absolute: 0 10*3/uL (ref 0.0–0.1)
Basophils Relative: 0 % (ref 0–1)
EOS PCT: 3 % (ref 0–5)
Eosinophils Absolute: 0.2 10*3/uL (ref 0.0–0.7)
HCT: 42.9 % (ref 36.0–46.0)
Hemoglobin: 15.2 g/dL — ABNORMAL HIGH (ref 12.0–15.0)
LYMPHS ABS: 2.1 10*3/uL (ref 0.7–4.0)
LYMPHS PCT: 36 % (ref 12–46)
MCH: 31 pg (ref 26.0–34.0)
MCHC: 35.4 g/dL (ref 30.0–36.0)
MCV: 87.6 fL (ref 78.0–100.0)
MONO ABS: 0.5 10*3/uL (ref 0.1–1.0)
Monocytes Relative: 8 % (ref 3–12)
Neutro Abs: 3.1 10*3/uL (ref 1.7–7.7)
Neutrophils Relative %: 52 % (ref 43–77)
Platelets: 230 10*3/uL (ref 150–400)
RBC: 4.9 MIL/uL (ref 3.87–5.11)
RDW: 12.3 % (ref 11.5–15.5)
WBC: 5.9 10*3/uL (ref 4.0–10.5)

## 2013-09-27 LAB — URINALYSIS, ROUTINE W REFLEX MICROSCOPIC
Bilirubin Urine: NEGATIVE
Glucose, UA: NEGATIVE mg/dL
HGB URINE DIPSTICK: NEGATIVE
KETONES UR: NEGATIVE mg/dL
Nitrite: NEGATIVE
PROTEIN: NEGATIVE mg/dL
Specific Gravity, Urine: 1.01 (ref 1.005–1.030)
Urobilinogen, UA: 0.2 mg/dL (ref 0.0–1.0)
pH: 6 (ref 5.0–8.0)

## 2013-09-27 LAB — URINE MICROSCOPIC-ADD ON

## 2013-09-27 LAB — LIPASE, BLOOD: Lipase: 29 U/L (ref 11–59)

## 2013-09-27 MED ORDER — IOHEXOL 300 MG/ML  SOLN
100.0000 mL | Freq: Once | INTRAMUSCULAR | Status: AC | PRN
  Administered 2013-09-27: 100 mL via INTRAVENOUS

## 2013-09-27 MED ORDER — SODIUM CHLORIDE 0.9 % IV BOLUS (SEPSIS)
1000.0000 mL | Freq: Once | INTRAVENOUS | Status: AC
  Administered 2013-09-27: 1000 mL via INTRAVENOUS

## 2013-09-27 MED ORDER — ONDANSETRON HCL 4 MG/2ML IJ SOLN
4.0000 mg | Freq: Once | INTRAMUSCULAR | Status: AC
  Administered 2013-09-27: 4 mg via INTRAVENOUS
  Filled 2013-09-27: qty 2

## 2013-09-27 MED ORDER — IOHEXOL 300 MG/ML  SOLN
50.0000 mL | Freq: Once | INTRAMUSCULAR | Status: AC | PRN
  Administered 2013-09-27: 50 mL via ORAL

## 2013-09-27 MED ORDER — OXYCODONE-ACETAMINOPHEN 5-325 MG PO TABS: 2.0000 | ORAL_TABLET | ORAL | Status: DC | PRN

## 2013-09-27 MED ORDER — ONDANSETRON 8 MG PO TBDP: 8.0000 mg | ORAL_TABLET | Freq: Three times a day (TID) | ORAL | Status: DC | PRN

## 2013-09-27 MED ORDER — CIPROFLOXACIN HCL 500 MG PO TABS: 500.0000 mg | ORAL_TABLET | Freq: Two times a day (BID) | ORAL | Status: DC

## 2013-09-27 MED ORDER — HYDROMORPHONE HCL PF 1 MG/ML IJ SOLN
1.0000 mg | Freq: Once | INTRAMUSCULAR | Status: AC
  Administered 2013-09-27: 1 mg via INTRAVENOUS
  Filled 2013-09-27: qty 1

## 2013-09-27 MED ORDER — METRONIDAZOLE 500 MG PO TABS: 500.0000 mg | ORAL_TABLET | Freq: Two times a day (BID) | ORAL | Status: DC

## 2013-09-27 NOTE — Discharge Instructions (Signed)
Abdominal Pain, Adult Many things can cause belly (abdominal) pain. Most times, the belly pain is not dangerous. Many cases of belly pain can be watched and treated at home. HOME CARE   Do not take medicines that help you go poop (laxatives) unless told to by your doctor.  Only take medicine as told by your doctor.  Eat or drink as told by your doctor. Your doctor will tell you if you should be on a special diet. GET HELP IF:  You do not know what is causing your belly pain.  You have belly pain while you are sick to your stomach (nauseous) or have runny poop (diarrhea).  You have pain while you pee or poop.  Your belly pain wakes you up at night.  You have belly pain that gets worse or better when you eat.  You have belly pain that gets worse when you eat fatty foods. GET HELP RIGHT AWAY IF:   The pain does not go away within 2 hours.  You have a fever.  You keep throwing up (vomiting).  The pain changes and is only in the right or left part of the belly.  You have bloody or tarry looking poop. MAKE SURE YOU:   Understand these instructions.  Will watch your condition.  Will get help right away if you are not doing well or get worse. Document Released: 09/28/2007 Document Revised: 01/30/2013 Document Reviewed: 12/19/2012 [facility] Patient Information 2014 [place]. Diverticulitis Small pockets or "bubbles" can develop in the wall of the intestine. Diverticulitis is when those pockets become infected and inflamed. This causes stomach pain (usually on the left side). HOME CARE  Take all medicine as told by your doctor.  Try a clear liquid diet (broth, tea, or water) for as long as told by your doctor.  Keep all follow-up visits with your doctor.  You may be put on a low-fiber diet once you start feeling better. Here are foods that have low-fiber:  White breads, cereals, rice, and pasta.  Cooked fruits and vegetables or soft fresh fruits and vegetables  without the skin.  Ground or well-cooked tender beef, ham, veal, lamb, pork, or poultry.  Eggs and seafood.  After you are doing well on the low-fiber diet, you may be put on a high-fiber diet. Here are ways to increase your fiber:  Choose whole-grain breads, cereals, pasta, and brown rice.  Choose fruits and vegetables with skin on. Do not overcook the vegetables.  Choose nuts, seeds, legumes, dried peas, beans, and lentils.  Look for food products that have more than 3 grams of fiber per serving on the food label. GET HELP RIGHT AWAY IF:  Your pain does not get better or gets worse.  You have trouble eating food.  You are not pooping (having bowel movements) like normal.  You have a temperature by mouth above 102 F (38.9 C), not controlled by medicine.  You keep throwing up (vomiting).  You have bloody or black, tarry poop (stools).  You are getting worse and not better. MAKE SURE YOU:   Understand these instructions.  Will watch your condition.  Will get help right away if you are not doing well or get worse. Document Released: 09/28/2007 Document Revised: 07/04/2011 Document Reviewed: 03/02/2009 [facility] Patient Information 2014 [place], [place].
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
## 2013-09-27 NOTE — ED Notes (Signed)
Patient states she has had left upper abdominal pain for the last one week.  States when the pain began, it woke her up in the middle of the night.  States she has had diarrhea x 2, one week ago and two days ago.  Describes the pain as a gnawing constant pain that varies in intensity. States she has a history of IBS and diverticulitis.  Symptoms are associated with a decreased appetite and mild nausea.

## 2013-10-03 MED ORDER — TRAZODONE HCL 50 MG PO TABS: ORAL_TABLET | ORAL | Status: DC

## 2013-10-03 NOTE — Telephone Encounter (Signed)
rx sent in to pharmacy for trazodone 50mg  1 1/2 tablet q hs #45 with one refill.  Pt had sent my chart with info on 50mg  one tablet qhs.  Needed to increase dose.

## 2013-10-30 ENCOUNTER — Encounter: Payer: Self-pay | Admitting: Internal Medicine

## 2013-10-30 ENCOUNTER — Telehealth: Payer: Self-pay | Admitting: Internal Medicine

## 2013-10-30 MED ORDER — TRAZODONE HCL 100 MG PO TABS: 100.0000 mg | ORAL_TABLET | Freq: Every day | ORAL | Status: DC

## 2013-10-30 NOTE — Telephone Encounter (Signed)
Per my chart message, pt doing well sleeping with 100mg  of trazodone.  Sent in rx for trazodone 100mg  #90 with no refills.

## 2014-01-02 ENCOUNTER — Encounter: Payer: BC Managed Care – PPO | Admitting: Internal Medicine

## 2014-01-27 ENCOUNTER — Other Ambulatory Visit: Payer: Self-pay | Admitting: Internal Medicine

## 2014-01-27 NOTE — Telephone Encounter (Signed)
Pt cancelled appt on 01/02/14. Please advise if okay to refill

## 2014-01-27 NOTE — Telephone Encounter (Signed)
Notify her that I will ok a refill x 1, but she needs a physical scheduled with me asap.  Needs to keep this appt.  Will not continue to refill.

## 2014-01-28 ENCOUNTER — Encounter: Payer: Self-pay | Admitting: *Deleted

## 2014-01-28 NOTE — Telephone Encounter (Signed)
Refilled Rx & notified pt via mychart. Also notified her to schedule a physical asap BEFORE she gets close to running out.

## 2014-01-31 NOTE — Telephone Encounter (Signed)
Unread mychart message mailed to patient 

## 2014-02-19 ENCOUNTER — Telehealth: Payer: Self-pay | Admitting: Internal Medicine

## 2014-02-19 DIAGNOSIS — R7989 Other specified abnormal findings of blood chemistry: Secondary | ICD-10-CM

## 2014-02-19 DIAGNOSIS — R739 Hyperglycemia, unspecified: Secondary | ICD-10-CM

## 2014-02-19 DIAGNOSIS — E7800 Pure hypercholesterolemia, unspecified: Secondary | ICD-10-CM

## 2014-02-19 NOTE — Telephone Encounter (Signed)
Orders placed for labs.  Please schedule lab appt.

## 2014-02-19 NOTE — Telephone Encounter (Signed)
The patient is wanting labs done before her physical on 11.14.15.

## 2014-02-19 NOTE — Telephone Encounter (Signed)
The patient has been scheduled

## 2014-02-24 ENCOUNTER — Other Ambulatory Visit (INDEPENDENT_AMBULATORY_CARE_PROVIDER_SITE_OTHER): Payer: BC Managed Care – PPO

## 2014-02-24 DIAGNOSIS — R946 Abnormal results of thyroid function studies: Secondary | ICD-10-CM

## 2014-02-24 DIAGNOSIS — E7800 Pure hypercholesterolemia, unspecified: Secondary | ICD-10-CM

## 2014-02-24 DIAGNOSIS — R7989 Other specified abnormal findings of blood chemistry: Secondary | ICD-10-CM

## 2014-02-24 DIAGNOSIS — R739 Hyperglycemia, unspecified: Secondary | ICD-10-CM

## 2014-02-24 LAB — BASIC METABOLIC PANEL
BUN: 18 mg/dL (ref 6–23)
CO2: 23 meq/L (ref 19–32)
CREATININE: 1 mg/dL (ref 0.4–1.2)
Calcium: 9 mg/dL (ref 8.4–10.5)
Chloride: 105 mEq/L (ref 96–112)
GFR: 62.13 mL/min (ref >60.00)
Glucose, Bld: 101 mg/dL — ABNORMAL HIGH (ref 70–99)
POTASSIUM: 3.7 meq/L (ref 3.5–5.1)
Sodium: 138 mEq/L (ref 135–145)

## 2014-02-24 LAB — CBC WITH DIFFERENTIAL/PLATELET
BASOS PCT: 0.6 % (ref 0.0–3.0)
Basophils Absolute: 0 10*3/uL (ref 0.0–0.1)
EOS PCT: 2.6 % (ref 0.0–5.0)
Eosinophils Absolute: 0.2 10*3/uL (ref 0.0–0.7)
HEMATOCRIT: 42.8 % (ref 36.0–46.0)
HEMOGLOBIN: 14.5 g/dL (ref 12.0–15.0)
Lymphocytes Relative: 33.2 % (ref 12.0–46.0)
Lymphs Abs: 2 10*3/uL (ref 0.7–4.0)
MCHC: 33.9 g/dL (ref 30.0–36.0)
MCV: 87.8 fl (ref 78.0–100.0)
MONO ABS: 0.4 10*3/uL (ref 0.1–1.0)
MONOS PCT: 7.1 % (ref 3.0–12.0)
NEUTROS ABS: 3.4 10*3/uL (ref 1.4–7.7)
Neutrophils Relative %: 56.5 % (ref 43.0–77.0)
Platelets: 239 10*3/uL (ref 150.0–400.0)
RBC: 4.88 Mil/uL (ref 3.87–5.11)
RDW: 12.8 % (ref 11.5–15.5)
WBC: 6.1 10*3/uL (ref 4.0–10.5)

## 2014-02-24 LAB — HEPATIC FUNCTION PANEL
ALK PHOS: 59 U/L (ref 39–117)
ALT: 30 U/L (ref 0–35)
AST: 24 U/L (ref 0–37)
Albumin: 3.9 g/dL (ref 3.5–5.2)
BILIRUBIN DIRECT: 0 mg/dL (ref 0.0–0.3)
BILIRUBIN TOTAL: 0.6 mg/dL (ref 0.2–1.2)
Total Protein: 7.5 g/dL (ref 6.0–8.3)

## 2014-02-24 LAB — LIPID PANEL
CHOL/HDL RATIO: 6
Cholesterol: 268 mg/dL — ABNORMAL HIGH (ref 0–200)
HDL: 44.1 mg/dL (ref >39.00)
NONHDL: 223.9
Triglycerides: 235 mg/dL — ABNORMAL HIGH (ref 0.0–149.0)
VLDL: 47 mg/dL — AB (ref 0.0–40.0)

## 2014-02-24 LAB — HEMOGLOBIN A1C: HEMOGLOBIN A1C: 5.7 % (ref 4.6–6.5)

## 2014-02-24 LAB — TSH: TSH: 4.03 u[IU]/mL (ref 0.35–4.50)

## 2014-02-24 LAB — LDL CHOLESTEROL, DIRECT: Direct LDL: 180.5 mg/dL

## 2014-02-25 ENCOUNTER — Encounter: Payer: Self-pay | Admitting: Internal Medicine

## 2014-02-26 ENCOUNTER — Encounter: Payer: Self-pay | Admitting: Internal Medicine

## 2014-02-26 ENCOUNTER — Ambulatory Visit (INDEPENDENT_AMBULATORY_CARE_PROVIDER_SITE_OTHER)
Admission: RE | Admit: 2014-02-26 | Discharge: 2014-02-26 | Disposition: A | Discharge status: Home / Self Care | Payer: BC Managed Care – PPO | Source: Ambulatory Visit | Attending: Internal Medicine | Admitting: Internal Medicine

## 2014-02-26 ENCOUNTER — Ambulatory Visit (INDEPENDENT_AMBULATORY_CARE_PROVIDER_SITE_OTHER): Payer: BC Managed Care – PPO | Admitting: Internal Medicine

## 2014-02-26 VITALS — BP 112/70 | HR 65 | Temp 98.2°F | Ht 60.1 in | Wt 156.5 lb

## 2014-02-26 DIAGNOSIS — Z860100 Personal history of colon polyps, unspecified: Secondary | ICD-10-CM

## 2014-02-26 DIAGNOSIS — M545 Low back pain: Secondary | ICD-10-CM

## 2014-02-26 DIAGNOSIS — Z658 Other specified problems related to psychosocial circumstances: Secondary | ICD-10-CM

## 2014-02-26 DIAGNOSIS — G479 Sleep disorder, unspecified: Secondary | ICD-10-CM

## 2014-02-26 DIAGNOSIS — N200 Calculus of kidney: Secondary | ICD-10-CM

## 2014-02-26 DIAGNOSIS — E7800 Pure hypercholesterolemia, unspecified: Secondary | ICD-10-CM

## 2014-02-26 DIAGNOSIS — F439 Reaction to severe stress, unspecified: Secondary | ICD-10-CM

## 2014-02-26 DIAGNOSIS — Z1239 Encounter for other screening for malignant neoplasm of breast: Secondary | ICD-10-CM

## 2014-02-26 DIAGNOSIS — L989 Disorder of the skin and subcutaneous tissue, unspecified: Secondary | ICD-10-CM

## 2014-02-26 DIAGNOSIS — R739 Hyperglycemia, unspecified: Secondary | ICD-10-CM

## 2014-02-26 DIAGNOSIS — K219 Gastro-esophageal reflux disease without esophagitis: Secondary | ICD-10-CM

## 2014-02-26 DIAGNOSIS — E2839 Other primary ovarian failure: Secondary | ICD-10-CM

## 2014-02-26 DIAGNOSIS — K589 Irritable bowel syndrome without diarrhea: Secondary | ICD-10-CM

## 2014-02-26 MED ORDER — TRAZODONE HCL 50 MG PO TABS: ORAL_TABLET | ORAL | Status: DC

## 2014-02-26 NOTE — Progress Notes (Signed)
Pre visit review using our clinic review tool, if applicable. No additional management support is needed unless otherwise documented below in the visit note. 

## 2014-02-26 NOTE — Telephone Encounter (Signed)
Unread mychart message mailed to patient 

## 2014-03-02 ENCOUNTER — Encounter: Payer: Self-pay | Admitting: Internal Medicine

## 2014-03-02 DIAGNOSIS — M549 Dorsalgia, unspecified: Secondary | ICD-10-CM | POA: Insufficient documentation

## 2014-03-02 DIAGNOSIS — G479 Sleep disorder, unspecified: Secondary | ICD-10-CM | POA: Insufficient documentation

## 2014-03-02 DIAGNOSIS — L989 Disorder of the skin and subcutaneous tissue, unspecified: Secondary | ICD-10-CM | POA: Insufficient documentation

## 2014-03-02 DIAGNOSIS — R739 Hyperglycemia, unspecified: Secondary | ICD-10-CM | POA: Insufficient documentation
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
## 2014-04-08 ENCOUNTER — Other Ambulatory Visit: Payer: Self-pay | Admitting: *Deleted

## 2014-04-08 ENCOUNTER — Telehealth: Payer: Self-pay | Admitting: Internal Medicine

## 2014-04-08 MED ORDER — CILIDINIUM-CHLORDIAZEPOXIDE 2.5-5 MG PO CAPS: 1.0000 | ORAL_CAPSULE | Freq: Two times a day (BID) | ORAL | Status: DC | PRN

## 2014-04-08 NOTE — Telephone Encounter (Signed)
Last OV 11.4.15.  Please advise refill 

## 2014-04-08 NOTE — Telephone Encounter (Signed)
Rx faxed

## 2014-04-08 NOTE — Telephone Encounter (Signed)
Refilled libraz #60 with no refills.

## 2014-04-08 NOTE — Telephone Encounter (Signed)
clidinium-chlordiazePOXIDE (LIBRAX) 2.5-5 MG per capsule #90 TAKE 1 CAPSULE THREE TIMES A DAY AS NEEDED

## 2014-06-23 ENCOUNTER — Other Ambulatory Visit: Payer: BC Managed Care – PPO

## 2014-06-25 ENCOUNTER — Ambulatory Visit: Payer: BC Managed Care – PPO | Admitting: Internal Medicine

## 2014-07-16 ENCOUNTER — Other Ambulatory Visit: Payer: Self-pay | Admitting: Internal Medicine

## 2014-07-16 MED ORDER — ATORVASTATIN CALCIUM 10 MG PO TABS: 10.0000 mg | ORAL_TABLET | Freq: Every day | ORAL | Status: DC

## 2014-07-16 NOTE — Telephone Encounter (Signed)
ok'd refill librax #60 with no refills and trazodone #150 with one refill.

## 2014-07-16 NOTE — Telephone Encounter (Signed)
Faxed to pharmacy

## 2014-07-16 NOTE — Telephone Encounter (Signed)
Last visit 02/26/14, ok refill?

## 2014-07-16 NOTE — Telephone Encounter (Signed)
Duplicate

## 2014-08-13 ENCOUNTER — Other Ambulatory Visit: Payer: Self-pay

## 2014-08-14 ENCOUNTER — Other Ambulatory Visit (INDEPENDENT_AMBULATORY_CARE_PROVIDER_SITE_OTHER): Payer: BLUE CROSS/BLUE SHIELD

## 2014-08-14 DIAGNOSIS — E7800 Pure hypercholesterolemia, unspecified: Secondary | ICD-10-CM

## 2014-08-14 DIAGNOSIS — M545 Low back pain: Secondary | ICD-10-CM

## 2014-08-14 DIAGNOSIS — E2839 Other primary ovarian failure: Secondary | ICD-10-CM | POA: Diagnosis not present

## 2014-08-14 DIAGNOSIS — R739 Hyperglycemia, unspecified: Secondary | ICD-10-CM

## 2014-08-14 LAB — LIPID PANEL
Cholesterol: 170 mg/dL (ref 0–200)
HDL: 56.1 mg/dL (ref >39.00)
LDL Cholesterol: 87 mg/dL (ref 0–99)
NONHDL: 113.9
Total CHOL/HDL Ratio: 3
Triglycerides: 135 mg/dL (ref 0.0–149.0)
VLDL: 27 mg/dL (ref 0.0–40.0)

## 2014-08-14 LAB — TSH: TSH: 5.06 u[IU]/mL — ABNORMAL HIGH (ref 0.35–4.50)

## 2014-08-14 LAB — HEPATIC FUNCTION PANEL
ALT: 30 U/L (ref 0–35)
AST: 23 U/L (ref 0–37)
Albumin: 4.8 g/dL (ref 3.5–5.2)
Alkaline Phosphatase: 66 U/L (ref 39–117)
BILIRUBIN TOTAL: 0.6 mg/dL (ref 0.2–1.2)
Bilirubin, Direct: 0.1 mg/dL (ref 0.0–0.3)
TOTAL PROTEIN: 7.8 g/dL (ref 6.0–8.3)

## 2014-08-14 LAB — HEMOGLOBIN A1C: Hgb A1c MFr Bld: 5.7 % (ref 4.6–6.5)

## 2014-08-14 LAB — BASIC METABOLIC PANEL
BUN: 16 mg/dL (ref 6–23)
CALCIUM: 9.4 mg/dL (ref 8.4–10.5)
CO2: 25 mEq/L (ref 19–32)
Chloride: 105 mEq/L (ref 96–112)
Creatinine, Ser: 1 mg/dL (ref 0.40–1.20)
GFR: 60.6 mL/min (ref >60.00)
Glucose, Bld: 104 mg/dL — ABNORMAL HIGH (ref 70–99)
Potassium: 3.7 mEq/L (ref 3.5–5.1)
SODIUM: 139 meq/L (ref 135–145)

## 2014-08-15 ENCOUNTER — Encounter: Payer: Self-pay | Admitting: Internal Medicine

## 2014-08-15 ENCOUNTER — Ambulatory Visit (INDEPENDENT_AMBULATORY_CARE_PROVIDER_SITE_OTHER): Payer: BLUE CROSS/BLUE SHIELD | Admitting: Internal Medicine

## 2014-08-15 VITALS — BP 116/70 | HR 73 | Temp 98.4°F | Resp 14 | Ht 60.25 in | Wt 166.6 lb

## 2014-08-15 DIAGNOSIS — K589 Irritable bowel syndrome without diarrhea: Secondary | ICD-10-CM

## 2014-08-15 DIAGNOSIS — Z860100 Personal history of colon polyps, unspecified: Secondary | ICD-10-CM

## 2014-08-15 DIAGNOSIS — F439 Reaction to severe stress, unspecified: Secondary | ICD-10-CM

## 2014-08-15 DIAGNOSIS — K219 Gastro-esophageal reflux disease without esophagitis: Secondary | ICD-10-CM | POA: Diagnosis not present

## 2014-08-15 DIAGNOSIS — E7800 Pure hypercholesterolemia, unspecified: Secondary | ICD-10-CM

## 2014-08-15 DIAGNOSIS — M545 Low back pain: Secondary | ICD-10-CM | POA: Diagnosis not present

## 2014-08-15 DIAGNOSIS — R079 Chest pain, unspecified: Secondary | ICD-10-CM | POA: Diagnosis not present

## 2014-08-15 DIAGNOSIS — R739 Hyperglycemia, unspecified: Secondary | ICD-10-CM

## 2014-08-15 DIAGNOSIS — Z0000 Encounter for general adult medical examination without abnormal findings: Secondary | ICD-10-CM

## 2014-08-15 DIAGNOSIS — Z658 Other specified problems related to psychosocial circumstances: Secondary | ICD-10-CM

## 2014-08-15 DIAGNOSIS — N2889 Other specified disorders of kidney and ureter: Secondary | ICD-10-CM

## 2014-08-15 DIAGNOSIS — R109 Unspecified abdominal pain: Secondary | ICD-10-CM

## 2014-08-15 MED ORDER — CILIDINIUM-CHLORDIAZEPOXIDE 2.5-5 MG PO CAPS: ORAL_CAPSULE | ORAL | Status: DC

## 2014-08-15 NOTE — Progress Notes (Signed)
Pre visit review using our clinic review tool, if applicable. No additional management support is needed unless otherwise documented below in the visit note. 
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
## 2014-08-16 ENCOUNTER — Encounter: Payer: Self-pay | Admitting: Internal Medicine

## 2014-08-16 DIAGNOSIS — Z0000 Encounter for general adult medical examination without abnormal findings: Secondary | ICD-10-CM | POA: Insufficient documentation

## 2014-08-16 DIAGNOSIS — R079 Chest pain, unspecified: Secondary | ICD-10-CM | POA: Insufficient documentation

## 2014-08-16 MED ORDER — ESTRADIOL 0.1 MG/GM VA CREA: TOPICAL_CREAM | VAGINAL | Status: DC

## 2014-08-16 MED ORDER — TRAZODONE HCL 50 MG PO TABS: ORAL_TABLET | ORAL | Status: DC

## 2014-08-16 MED ORDER — ATORVASTATIN CALCIUM 10 MG PO TABS: 10.0000 mg | ORAL_TABLET | Freq: Every day | ORAL | Status: DC

## 2014-08-16 MED ORDER — FLUTICASONE PROPIONATE 50 MCG/ACT NA SUSP: 2.0000 | Freq: Every day | NASAL | Status: DC

## 2014-08-16 MED ORDER — TRAMADOL HCL 50 MG PO TABS: 50.0000 mg | ORAL_TABLET | Freq: Every day | ORAL | Status: DC

## 2014-08-16 NOTE — Assessment & Plan Note (Signed)
Low carb diet and exercise.  Follow met b and a1c.   Lab Results  Component Value Date   HGBA1C 5.7 08/14/2014

## 2014-08-16 NOTE — Assessment & Plan Note (Signed)
Saw GI recently.  Had colonoscopy.  Instructed to take librax.  This relieves the pain.  Does have the bowel change.  Instructed to restart miralax and fiber.  Follow bowels.  Treat the increased stress.  Follow.

## 2014-08-16 NOTE — Assessment & Plan Note (Signed)
Intermittent chest pain.  Some sob with exertion.  EKG revealed SR with no acute ischemic changes.  Had negative stress test 2014.  Will refer to cardiology for further evaluation and testing.  Pt comfortable with this plan.

## 2014-08-16 NOTE — Assessment & Plan Note (Signed)
Persistent.  Xray as outlined.  Discussed therapy.  Follow.

## 2014-08-16 NOTE — Assessment & Plan Note (Signed)
Saw GI.  Had colonoscopy.  Diagnosed with IBS.  Follow.

## 2014-08-16 NOTE — Assessment & Plan Note (Signed)
Followed by Dr Cope.  

## 2014-08-16 NOTE — Assessment & Plan Note (Signed)
On atorvastatin.  Cholesterol improved.  Low cholesterol diet and exercise.  Follow lipid panel and liver function tests.   Lab Results  Component Value Date   CHOL 170 08/14/2014   HDL 56.10 08/14/2014   LDLCALC 87 08/14/2014   LDLDIRECT 180.5 02/24/2014   TRIG 135.0 08/14/2014   CHOLHDL 3 08/14/2014

## 2014-08-16 NOTE — Assessment & Plan Note (Signed)
Reflux appears to be controlled.  Follow.

## 2014-08-16 NOTE — Assessment & Plan Note (Signed)
Increased stress as outlined.  Discussed with her today.  Exercise.  Will follow.

## 2014-08-16 NOTE — Assessment & Plan Note (Signed)
Physical 02/26/14.  S/p hysterectomy.  Just had colonoscopy.  States ok.  Obtain records.  Needs mammogram.

## 2014-08-16 NOTE — Assessment & Plan Note (Signed)
Resolves with librax.  Follow.

## 2014-08-21 ENCOUNTER — Telehealth: Payer: Self-pay | Admitting: Cardiovascular Disease

## 2014-08-21 NOTE — Telephone Encounter (Signed)
Per Dr. Nicki Reaper patient had persistant episodes of cp and wants to see Dr. Rockey Situ No Timeframe recommended  First available is 09-15-14.  Is this ok?

## 2014-09-15 ENCOUNTER — Encounter: Payer: Self-pay | Admitting: Cardiovascular Disease

## 2014-09-15 ENCOUNTER — Ambulatory Visit (INDEPENDENT_AMBULATORY_CARE_PROVIDER_SITE_OTHER): Payer: BLUE CROSS/BLUE SHIELD | Admitting: Cardiovascular Disease

## 2014-09-15 VITALS — BP 110/70 | HR 75 | Ht 60.0 in | Wt 168.0 lb

## 2014-09-15 DIAGNOSIS — M545 Low back pain: Secondary | ICD-10-CM

## 2014-09-15 DIAGNOSIS — R079 Chest pain, unspecified: Secondary | ICD-10-CM

## 2014-09-15 DIAGNOSIS — R0602 Shortness of breath: Secondary | ICD-10-CM

## 2014-09-15 NOTE — Assessment & Plan Note (Signed)
She does report some discomfort in her back radiating through to her front left side. Unclear if this is cardiac in etiology. If stress test is normal, should consider musculoskeletal disorder.
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
## 2014-09-15 NOTE — Patient Instructions (Addendum)
You are doing well. No medication changes were made.  We will order a treadmill myoview for shortness of breath and chest  pain  Please call us if you have new issues that need to be addressed before your next appt.     [place]  Your caregiver has ordered a Stress Test with nuclear imaging. The purpose of this test is to evaluate the blood supply to your heart muscle. This procedure is referred to as a "Non-Invasive Stress Test." This is because other than having an IV started in your vein, nothing is inserted or "invades" your body. Cardiac stress tests are done to find areas of poor blood flow to the heart by determining the extent of coronary artery disease (CAD). Some patients exercise on a treadmill, which naturally increases the blood flow to your heart, while others who are  unable to walk on a treadmill due to physical limitations have a pharmacologic/chemical stress agent called Lexiscan . This medicine will mimic walking on a treadmill by temporarily increasing your coronary blood flow.   Please note: these test may take anywhere between 2-4 hours to complete  PLEASE REPORT TO [place] AT THE FIRST DESK WILL DIRECT YOU WHERE TO GO  Date of Procedure:_______5/24/2016____________________  Arrival Time for Procedure:____7:15 am_________________ Instructions regarding medication:     PLEASE NOTIFY THE OFFICE AT LEAST 24 HOURS IN ADVANCE IF YOU ARE UNABLE TO KEEP YOUR APPOINTMENT.  [phone] AND  PLEASE NOTIFY NUCLEAR MEDICINE AT [facility] AT LEAST 24 HOURS IN ADVANCE IF YOU ARE UNABLE TO KEEP YOUR APPOINTMENT. [phone]  How to prepare for your Myoview test:  1. Do not eat or drink after midnight 2. No caffeine for 24 hours prior to test 3. No smoking 24 hours prior to test. 4. Your medication may be taken with water.  If your doctor stopped a medication because of this test, do not take that medication. 5. Ladies, please do not wear  dresses.  Skirts or pants are appropriate. Please wear a short sleeve shirt. 6. No perfume, cologne or lotion. 7. Wear comfortable walking shoes. No heels!

## 2014-09-15 NOTE — Assessment & Plan Note (Signed)
Chest pain symptoms have some typical as well as atypical features. Moderate risk given her strong family history of coronary artery disease and stroke. Various treatment options discussed with her. She does not want a plain treadmill study. She prefers Myoview given the higher sensitivity/specificity of determining high risk stenosis. Treadmill Myoview will be ordered for her in the next week for workup of her chest pain and shortness of breath symptoms.

## 2014-09-15 NOTE — Assessment & Plan Note (Signed)
Encouraged her to stay on her Lipitor. Cholesterol significantly improved on a statin compared to prior numbers per the patient Strong family history of stroke and coronary artery disease

## 2014-09-16 ENCOUNTER — Ambulatory Visit
Admission: RE | Admit: 2014-09-16 | Discharge: 2014-09-16 | Disposition: A | Discharge status: Home / Self Care | Payer: BLUE CROSS/BLUE SHIELD | Source: Ambulatory Visit | Attending: Cardiovascular Disease | Admitting: Cardiovascular Disease

## 2014-09-16 ENCOUNTER — Telehealth: Payer: Self-pay | Admitting: *Deleted

## 2014-09-16 DIAGNOSIS — R0602 Shortness of breath: Secondary | ICD-10-CM | POA: Diagnosis not present

## 2014-09-16 DIAGNOSIS — R079 Chest pain, unspecified: Secondary | ICD-10-CM | POA: Diagnosis not present

## 2014-09-16 LAB — NM MYOCAR MULTI W/SPECT W/WALL MOTION / EF
CHL CUP NUCLEAR SSS: 0
Estimated workload: 8.4 METS
Exercise duration (min): 7 min
LV dias vol: 39 mL
LV sys vol: 11 mL
Peak HR: 146 {beats}/min
Rest HR: 69 {beats}/min
SDS: 0
SRS: 4
TID: 0.97

## 2014-09-16 MED ORDER — TECHNETIUM TC 99M SESTAMIBI GENERIC - CARDIOLITE
32.8560 | Freq: Once | INTRAVENOUS | Status: AC | PRN
  Administered 2014-09-16: 32.856 via INTRAVENOUS

## 2014-09-16 MED ORDER — TECHNETIUM TC 99M SESTAMIBI GENERIC - CARDIOLITE
13.9810 | Freq: Once | INTRAVENOUS | Status: AC | PRN
  Administered 2014-09-16: 13.981 via INTRAVENOUS

## 2014-09-16 NOTE — Telephone Encounter (Signed)
Pt had myoview done today, and was told to call for we will have the results ready.  Stated to her doctor is in clinic and we will call when ready.  She said she will try and call tmrw for the results Please call when ready.

## 2014-09-17 ENCOUNTER — Telehealth: Payer: Self-pay | Admitting: *Deleted

## 2014-09-17 NOTE — Telephone Encounter (Signed)
Please see result note 

## 2014-09-17 NOTE — Telephone Encounter (Signed)
See previous phone note.  

## 2014-09-17 NOTE — Telephone Encounter (Signed)
Pt called stating she is getting anxious about results.  Stating that she is still not feeling better than she did on Monday,  Was told by doctor Gollan someone would call her yesterday with results.  Stated to her again that he is in clinic and I apologized She would not understand why he would tell her that he would read it the same day she did the test and get the results that same day. I apologized again and said it takes a day or two for [place] to read them for he is also seeing patients and the hospital was a bit busy.  She just wants a nurse to call her for she said her chest pain's have not changed since we last saw her Monday, she said this could be anxiety or her being more anxious to getting the results.  Please advise.

## 2014-11-04 ENCOUNTER — Ambulatory Visit (INDEPENDENT_AMBULATORY_CARE_PROVIDER_SITE_OTHER): Payer: BLUE CROSS/BLUE SHIELD | Admitting: Internal Medicine

## 2014-11-04 ENCOUNTER — Encounter: Payer: Self-pay | Admitting: Internal Medicine

## 2014-11-04 VITALS — BP 110/80 | HR 77 | Temp 98.4°F | Ht 60.0 in | Wt 171.2 lb

## 2014-11-04 DIAGNOSIS — Z86010 Personal history of colonic polyps: Secondary | ICD-10-CM

## 2014-11-04 DIAGNOSIS — M2550 Pain in unspecified joint: Secondary | ICD-10-CM

## 2014-11-04 DIAGNOSIS — E7800 Pure hypercholesterolemia, unspecified: Secondary | ICD-10-CM

## 2014-11-04 DIAGNOSIS — R739 Hyperglycemia, unspecified: Secondary | ICD-10-CM | POA: Diagnosis not present

## 2014-11-04 DIAGNOSIS — Z0000 Encounter for general adult medical examination without abnormal findings: Secondary | ICD-10-CM

## 2014-11-04 DIAGNOSIS — N2889 Other specified disorders of kidney and ureter: Secondary | ICD-10-CM

## 2014-11-04 DIAGNOSIS — K219 Gastro-esophageal reflux disease without esophagitis: Secondary | ICD-10-CM | POA: Diagnosis not present

## 2014-11-04 DIAGNOSIS — Z658 Other specified problems related to psychosocial circumstances: Secondary | ICD-10-CM

## 2014-11-04 DIAGNOSIS — K589 Irritable bowel syndrome without diarrhea: Secondary | ICD-10-CM

## 2014-11-04 DIAGNOSIS — F439 Reaction to severe stress, unspecified: Secondary | ICD-10-CM

## 2014-11-04 LAB — BASIC METABOLIC PANEL
BUN: 15 mg/dL (ref 6–23)
CALCIUM: 9.5 mg/dL (ref 8.4–10.5)
CO2: 27 meq/L (ref 19–32)
Chloride: 104 mEq/L (ref 96–112)
Creatinine, Ser: 0.96 mg/dL (ref 0.40–1.20)
GFR: 63.47 mL/min (ref >60.00)
GLUCOSE: 94 mg/dL (ref 70–99)
Potassium: 3.9 mEq/L (ref 3.5–5.1)
Sodium: 141 mEq/L (ref 135–145)

## 2014-11-04 LAB — CBC WITH DIFFERENTIAL/PLATELET
Basophils Absolute: 0 10*3/uL (ref 0.0–0.1)
Basophils Relative: 0.3 % (ref 0.0–3.0)
Eosinophils Absolute: 0.1 10*3/uL (ref 0.0–0.7)
Eosinophils Relative: 2.5 % (ref 0.0–5.0)
HCT: 43 % (ref 36.0–46.0)
Hemoglobin: 14.7 g/dL (ref 12.0–15.0)
LYMPHS PCT: 30.5 % (ref 12.0–46.0)
Lymphs Abs: 1.7 10*3/uL (ref 0.7–4.0)
MCHC: 34.3 g/dL (ref 30.0–36.0)
MCV: 88.6 fl (ref 78.0–100.0)
MONOS PCT: 7.5 % (ref 3.0–12.0)
Monocytes Absolute: 0.4 10*3/uL (ref 0.1–1.0)
NEUTROS PCT: 59.2 % (ref 43.0–77.0)
Neutro Abs: 3.3 10*3/uL (ref 1.4–7.7)
PLATELETS: 233 10*3/uL (ref 150.0–400.0)
RBC: 4.85 Mil/uL (ref 3.87–5.11)
RDW: 12.9 % (ref 11.5–15.5)
WBC: 5.6 10*3/uL (ref 4.0–10.5)

## 2014-11-04 LAB — URIC ACID: Uric Acid, Serum: 7.1 mg/dL — ABNORMAL HIGH (ref 2.4–7.0)

## 2014-11-04 LAB — TSH: TSH: 3.55 u[IU]/mL (ref 0.35–4.50)

## 2014-11-04 LAB — LIPID PANEL
CHOL/HDL RATIO: 3
Cholesterol: 165 mg/dL (ref 0–200)
HDL: 48.1 mg/dL (ref >39.00)
LDL CALC: 87 mg/dL (ref 0–99)
NonHDL: 116.9
Triglycerides: 150 mg/dL — ABNORMAL HIGH (ref 0.0–149.0)
VLDL: 30 mg/dL (ref 0.0–40.0)

## 2014-11-04 LAB — SEDIMENTATION RATE: SED RATE: 7 mm/h (ref 0–22)

## 2014-11-04 LAB — HEPATIC FUNCTION PANEL
ALBUMIN: 4.6 g/dL (ref 3.5–5.2)
ALK PHOS: 63 U/L (ref 39–117)
ALT: 37 U/L — AB (ref 0–35)
AST: 24 U/L (ref 0–37)
Bilirubin, Direct: 0.1 mg/dL (ref 0.0–0.3)
TOTAL PROTEIN: 7.3 g/dL (ref 6.0–8.3)
Total Bilirubin: 0.6 mg/dL (ref 0.2–1.2)

## 2014-11-04 LAB — RHEUMATOID FACTOR: Rhuematoid fact SerPl-aCnc: <10 IU/mL (ref <=14)

## 2014-11-04 LAB — HEMOGLOBIN A1C: Hgb A1c MFr Bld: 5.7 % (ref 4.6–6.5)

## 2014-11-04 LAB — C-REACTIVE PROTEIN: CRP: 0.4 mg/dL — ABNORMAL LOW (ref 0.5–20.0)

## 2014-11-04 MED ORDER — DEXLANSOPRAZOLE 60 MG PO CPDR: 60.0000 mg | DELAYED_RELEASE_CAPSULE | Freq: Every day | ORAL | Status: DC

## 2014-11-04 NOTE — Progress Notes (Signed)
Pre visit review using our clinic review tool, if applicable. No additional management support is needed unless otherwise documented below in the visit note. 
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
## 2014-11-05 LAB — ANA: ANA: NEGATIVE

## 2014-11-06 ENCOUNTER — Telehealth: Payer: Self-pay | Admitting: Internal Medicine

## 2014-11-06 ENCOUNTER — Other Ambulatory Visit: Payer: Self-pay | Admitting: Internal Medicine

## 2014-11-06 ENCOUNTER — Telehealth: Payer: Self-pay

## 2014-11-06 DIAGNOSIS — R945 Abnormal results of liver function studies: Secondary | ICD-10-CM

## 2014-11-06 DIAGNOSIS — R7989 Other specified abnormal findings of blood chemistry: Secondary | ICD-10-CM

## 2014-11-06 NOTE — Telephone Encounter (Signed)
See result note that I sent you.  We will need to recheck her liver panel in 3-4 weeks.  Please schedule her for a non fasting lab.   Ok to give copy of labs.

## 2014-11-06 NOTE — Telephone Encounter (Signed)
The patient called requesting lab results and a copy of those results to take to another doctor.

## 2014-11-06 NOTE — Telephone Encounter (Signed)
Started PA process for Dexilant for patient on cover My meds.

## 2014-11-06 NOTE — Telephone Encounter (Signed)
Pt scheduled  

## 2014-11-06 NOTE — Progress Notes (Signed)
Order placed for f/u liver panel.  

## 2014-11-09 ENCOUNTER — Encounter: Payer: Self-pay | Admitting: Internal Medicine

## 2014-11-10 ENCOUNTER — Ambulatory Visit
Admission: RE | Admit: 2014-11-10 | Discharge: 2014-11-10 | Disposition: A | Discharge status: Home / Self Care | Payer: BLUE CROSS/BLUE SHIELD | Source: Ambulatory Visit | Attending: Internal Medicine | Admitting: Internal Medicine

## 2014-11-10 ENCOUNTER — Other Ambulatory Visit: Payer: BLUE CROSS/BLUE SHIELD

## 2014-11-10 DIAGNOSIS — Z1239 Encounter for other screening for malignant neoplasm of breast: Secondary | ICD-10-CM

## 2014-11-10 NOTE — Assessment & Plan Note (Signed)
No significant symptoms reported today.  Follow.

## 2014-11-10 NOTE — Assessment & Plan Note (Signed)
Followed by Dr Cope.  

## 2014-11-10 NOTE — Assessment & Plan Note (Signed)
On lipitor.  Low cholesterol diet and exercise.  Follow lipid panel and liver function tests.   

## 2014-11-10 NOTE — Assessment & Plan Note (Signed)
Had colonoscopy.  Diagnosed with IBS.  Stable.

## 2014-11-10 NOTE — Assessment & Plan Note (Signed)
Low carb diet and exercise.  Follow met b and a1c.   Lab Results  Component Value Date   HGBA1C 5.7 11/04/2014

## 2014-11-10 NOTE — Assessment & Plan Note (Signed)
Increased stress.  Discussed with her today.  Does not feel she needs any further intervention at this time.  Follow.   

## 2014-11-10 NOTE — Assessment & Plan Note (Signed)
Multiple joint pains as outlined.  Pain with rotations of her forearm - extending into her elbow.  This appears to be more c/w tendonitis.  She takes ibuprofen.  Instructed to decrease amount.  Elbow strap.  Has appt with Dr Amedeo Plenty next week.  Check rheum panel.

## 2014-11-10 NOTE — Assessment & Plan Note (Signed)
Saw GI.  Had colonoscopy.  Recommended continued librax.

## 2014-11-10 NOTE — Assessment & Plan Note (Signed)
Physical 02/26/14.  S/p hysterectomy.  Had colonoscopy.  Missed mammogram appointment.  Reschedule.

## 2014-11-14 NOTE — Telephone Encounter (Signed)
Medication was denied. Sent patient a Pharmacist, community message asking for additional information.

## 2014-11-21 ENCOUNTER — Telehealth: Payer: Self-pay | Admitting: *Deleted

## 2014-11-21 NOTE — Telephone Encounter (Addendum)
Pt states that she has tried Pantoprazole 20mg  BID x 3 months (no help), She has also tried Omeprazole & Nexium. She was Rx'd Dexilant by GI originally for diverticulitis & IBS and it worked great.  PA was resubmitted with this information.

## 2014-11-21 NOTE — Telephone Encounter (Signed)
Opened in error

## 2014-11-21 NOTE — Telephone Encounter (Signed)
Dexilant was approved-pt & pharmacy notified

## 2014-11-25 ENCOUNTER — Other Ambulatory Visit: Payer: BLUE CROSS/BLUE SHIELD

## 2015-01-08 ENCOUNTER — Other Ambulatory Visit: Payer: Self-pay | Admitting: Internal Medicine

## 2015-01-08 NOTE — Telephone Encounter (Signed)
Please advise on refill of Tramadol. 

## 2015-01-09 NOTE — Telephone Encounter (Signed)
Refilled tramadol #30 with no refills.

## 2015-02-19 ENCOUNTER — Other Ambulatory Visit: Payer: Self-pay | Admitting: Internal Medicine

## 2015-03-10 ENCOUNTER — Other Ambulatory Visit (INDEPENDENT_AMBULATORY_CARE_PROVIDER_SITE_OTHER): Payer: BLUE CROSS/BLUE SHIELD

## 2015-03-10 DIAGNOSIS — R7989 Other specified abnormal findings of blood chemistry: Secondary | ICD-10-CM | POA: Diagnosis not present

## 2015-03-10 DIAGNOSIS — R945 Abnormal results of liver function studies: Secondary | ICD-10-CM

## 2015-03-10 LAB — HEPATIC FUNCTION PANEL
ALBUMIN: 4.6 g/dL (ref 3.5–5.2)
ALT: 47 U/L — ABNORMAL HIGH (ref 0–35)
AST: 26 U/L (ref 0–37)
Alkaline Phosphatase: 66 U/L (ref 39–117)
Bilirubin, Direct: 0.1 mg/dL (ref 0.0–0.3)
Total Bilirubin: 0.5 mg/dL (ref 0.2–1.2)
Total Protein: 7.1 g/dL (ref 6.0–8.3)

## 2015-03-11 ENCOUNTER — Encounter: Payer: Self-pay | Admitting: Internal Medicine

## 2015-03-12 ENCOUNTER — Ambulatory Visit (INDEPENDENT_AMBULATORY_CARE_PROVIDER_SITE_OTHER): Payer: BLUE CROSS/BLUE SHIELD | Admitting: Internal Medicine

## 2015-03-12 ENCOUNTER — Encounter: Payer: Self-pay | Admitting: Internal Medicine

## 2015-03-12 VITALS — BP 120/80 | HR 91 | Temp 98.5°F | Resp 18 | Ht 60.0 in | Wt 168.0 lb

## 2015-03-12 DIAGNOSIS — G43809 Other migraine, not intractable, without status migrainosus: Secondary | ICD-10-CM

## 2015-03-12 DIAGNOSIS — Z1239 Encounter for other screening for malignant neoplasm of breast: Secondary | ICD-10-CM

## 2015-03-12 DIAGNOSIS — R7989 Other specified abnormal findings of blood chemistry: Secondary | ICD-10-CM

## 2015-03-12 DIAGNOSIS — F439 Reaction to severe stress, unspecified: Secondary | ICD-10-CM

## 2015-03-12 DIAGNOSIS — E7800 Pure hypercholesterolemia, unspecified: Secondary | ICD-10-CM

## 2015-03-12 DIAGNOSIS — Z0000 Encounter for general adult medical examination without abnormal findings: Secondary | ICD-10-CM | POA: Diagnosis not present

## 2015-03-12 DIAGNOSIS — R945 Abnormal results of liver function studies: Secondary | ICD-10-CM

## 2015-03-12 DIAGNOSIS — Z9109 Other allergy status, other than to drugs and biological substances: Secondary | ICD-10-CM

## 2015-03-12 DIAGNOSIS — R739 Hyperglycemia, unspecified: Secondary | ICD-10-CM

## 2015-03-12 DIAGNOSIS — K219 Gastro-esophageal reflux disease without esophagitis: Secondary | ICD-10-CM

## 2015-03-12 DIAGNOSIS — K589 Irritable bowel syndrome without diarrhea: Secondary | ICD-10-CM

## 2015-03-12 DIAGNOSIS — M7989 Other specified soft tissue disorders: Secondary | ICD-10-CM

## 2015-03-12 DIAGNOSIS — N2889 Other specified disorders of kidney and ureter: Secondary | ICD-10-CM

## 2015-03-12 MED ORDER — CILIDINIUM-CHLORDIAZEPOXIDE 2.5-5 MG PO CAPS: 1.0000 | ORAL_CAPSULE | Freq: Three times a day (TID) | ORAL | Status: DC | PRN

## 2015-03-12 MED ORDER — CELECOXIB 200 MG PO CAPS: 200.0000 mg | ORAL_CAPSULE | Freq: Every day | ORAL | Status: DC
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
## 2015-03-12 NOTE — Progress Notes (Signed)
Pre-visit discussion using our clinic review tool. No additional management support is needed unless otherwise documented below in the visit note.  

## 2015-03-12 NOTE — Patient Instructions (Signed)
Zantac (ranitidine) 150mg  - take one tablet 30 minutes before breakfast   flonase nasal spray - 2 sprays each nostril one time per day.  Do this in the evening.    Saline nasal spray - flush nose at least 2-3x/day.

## 2015-03-21 ENCOUNTER — Encounter: Payer: Self-pay | Admitting: Internal Medicine

## 2015-03-21 DIAGNOSIS — M7989 Other specified soft tissue disorders: Secondary | ICD-10-CM | POA: Insufficient documentation

## 2015-03-21 DIAGNOSIS — Z9109 Other allergy status, other than to drugs and biological substances: Secondary | ICD-10-CM | POA: Insufficient documentation

## 2015-03-21 NOTE — Assessment & Plan Note (Signed)
Will notify me of which surgeon she wants to see.

## 2015-03-21 NOTE — Assessment & Plan Note (Signed)
Low carb die and exercise.  Follow met b and a1c.

## 2015-03-21 NOTE — Assessment & Plan Note (Signed)
Physical today 03/17/15.  S/p hysterectomy.  Had colonoscopy.  Schedule mammogram.  Missed.

## 2015-03-21 NOTE — Assessment & Plan Note (Signed)
Increased stress.  She feels she is handling things relatively well.  Does not feel needs any further intervention.  Follow.

## 2015-03-21 NOTE — Assessment & Plan Note (Signed)
No upper symptoms reported today.  Follow.  On dexilant.

## 2015-03-21 NOTE — Assessment & Plan Note (Signed)
Saw GI.  Had colonoscopy.  Recommended continue librax.

## 2015-03-21 NOTE — Assessment & Plan Note (Signed)
Low cholesterol diet and exercise.  Follow lipid panel and liver function tests.  On lipitor.   

## 2015-03-21 NOTE — Assessment & Plan Note (Signed)
Symptoms appear to be c/w allergies.  Continue zyrtec.  Saline nasal spray and flonase nasal spray as directed.  Follow.  Do not feel abx warranted.

## 2015-03-21 NOTE — Assessment & Plan Note (Signed)
MRI previously reported negative.  No problems reported with headaches today.  Follow.

## 2015-03-21 NOTE — Assessment & Plan Note (Signed)
Has been followed by Dr Cope.   

## 2015-03-23 ENCOUNTER — Telehealth: Payer: Self-pay | Admitting: Internal Medicine

## 2015-03-23 NOTE — Telephone Encounter (Signed)
Notify her that this would be a totally separate test.  What she is scheduled for is abdominal ultrasound.  Would recommend keeping appt with surgery and abdominal ultrasound.  Thanks

## 2015-03-23 NOTE — Telephone Encounter (Signed)
Pt wanted to know if she could have the spot on her leg ultrasound while she is there so her deductible is not so high instead of going to the surgeon. Please advise pt.msn

## 2015-03-25 ENCOUNTER — Ambulatory Visit
Admission: RE | Admit: 2015-03-25 | Discharge: 2015-03-25 | Disposition: A | Discharge status: Home / Self Care | Payer: BLUE CROSS/BLUE SHIELD | Source: Ambulatory Visit | Attending: Internal Medicine | Admitting: Internal Medicine

## 2015-03-25 DIAGNOSIS — R945 Abnormal results of liver function studies: Secondary | ICD-10-CM

## 2015-03-25 DIAGNOSIS — R7989 Other specified abnormal findings of blood chemistry: Secondary | ICD-10-CM | POA: Diagnosis not present

## 2015-03-26 ENCOUNTER — Encounter: Payer: Self-pay | Admitting: *Deleted

## 2015-03-26 DIAGNOSIS — R945 Abnormal results of liver function studies: Secondary | ICD-10-CM

## 2015-03-26 DIAGNOSIS — N281 Cyst of kidney, acquired: Secondary | ICD-10-CM

## 2015-03-26 DIAGNOSIS — E7800 Pure hypercholesterolemia, unspecified: Secondary | ICD-10-CM

## 2015-03-26 DIAGNOSIS — Z0000 Encounter for general adult medical examination without abnormal findings: Secondary | ICD-10-CM

## 2015-03-26 DIAGNOSIS — R7989 Other specified abnormal findings of blood chemistry: Secondary | ICD-10-CM

## 2015-03-27 NOTE — Telephone Encounter (Signed)
Order placed for labs.

## 2015-03-29 NOTE — Telephone Encounter (Signed)
Pt notified via my chart that order has been placed for referral to urology.

## 2015-03-29 NOTE — Addendum Note (Signed)
Addended by: Alisa Graff on: 03/29/2015 09:13 PM   Modules accepted: Orders

## 2015-03-29 NOTE — Telephone Encounter (Signed)
Orders placed for referral to GI and urology.

## 2015-04-01 ENCOUNTER — Ambulatory Visit
Admission: RE | Admit: 2015-04-01 | Discharge: 2015-04-01 | Disposition: A | Discharge status: Home / Self Care | Payer: BLUE CROSS/BLUE SHIELD | Source: Ambulatory Visit | Attending: Internal Medicine | Admitting: Internal Medicine

## 2015-04-01 DIAGNOSIS — Z1239 Encounter for other screening for malignant neoplasm of breast: Secondary | ICD-10-CM

## 2015-04-01 DIAGNOSIS — Z1231 Encounter for screening mammogram for malignant neoplasm of breast: Secondary | ICD-10-CM | POA: Diagnosis present

## 2015-04-01 DIAGNOSIS — Z13820 Encounter for screening for osteoporosis: Secondary | ICD-10-CM | POA: Insufficient documentation

## 2015-04-01 DIAGNOSIS — E2839 Other primary ovarian failure: Secondary | ICD-10-CM

## 2015-04-03 ENCOUNTER — Encounter: Payer: Self-pay | Admitting: Internal Medicine

## 2015-04-03 ENCOUNTER — Telehealth: Payer: Self-pay | Admitting: Internal Medicine

## 2015-04-03 NOTE — Telephone Encounter (Signed)
Pt is returning call from Dr. Nicki Reaper at 11:00 on 12/9. Please call pt.  Thank you,  kp

## 2015-04-03 NOTE — Telephone Encounter (Signed)
Please advise 

## 2015-04-03 NOTE — Telephone Encounter (Signed)
I have not called her.  Have you tried to reach her?  Dr Nicki Reaper

## 2015-04-03 NOTE — Telephone Encounter (Signed)
I did not call this patient. No documention of who may have called her unless it was the automatic system.

## 2015-04-08 ENCOUNTER — Other Ambulatory Visit (INDEPENDENT_AMBULATORY_CARE_PROVIDER_SITE_OTHER): Payer: BLUE CROSS/BLUE SHIELD

## 2015-04-08 ENCOUNTER — Other Ambulatory Visit: Payer: BLUE CROSS/BLUE SHIELD

## 2015-04-08 DIAGNOSIS — R339 Retention of urine, unspecified: Secondary | ICD-10-CM | POA: Insufficient documentation

## 2015-04-08 DIAGNOSIS — N281 Cyst of kidney, acquired: Secondary | ICD-10-CM | POA: Insufficient documentation

## 2015-04-08 DIAGNOSIS — E7800 Pure hypercholesterolemia, unspecified: Secondary | ICD-10-CM

## 2015-04-08 DIAGNOSIS — Z0000 Encounter for general adult medical examination without abnormal findings: Secondary | ICD-10-CM

## 2015-04-09 ENCOUNTER — Encounter: Payer: Self-pay | Admitting: Internal Medicine

## 2015-04-09 LAB — HEPATIC FUNCTION PANEL
ALT: 40 U/L — ABNORMAL HIGH (ref 0–35)
AST: 26 U/L (ref 0–37)
Albumin: 4.6 g/dL (ref 3.5–5.2)
Alkaline Phosphatase: 69 U/L (ref 39–117)
BILIRUBIN TOTAL: 0.5 mg/dL (ref 0.2–1.2)
Bilirubin, Direct: 0.1 mg/dL (ref 0.0–0.3)
Total Protein: 7.2 g/dL (ref 6.0–8.3)

## 2015-04-09 LAB — BASIC METABOLIC PANEL
BUN: 17 mg/dL (ref 6–23)
CHLORIDE: 105 meq/L (ref 96–112)
CO2: 30 meq/L (ref 19–32)
Calcium: 9.8 mg/dL (ref 8.4–10.5)
Creatinine, Ser: 0.98 mg/dL (ref 0.40–1.20)
GFR: 61.89 mL/min (ref >60.00)
GLUCOSE: 104 mg/dL — AB (ref 70–99)
POTASSIUM: 3.5 meq/L (ref 3.5–5.1)
Sodium: 142 mEq/L (ref 135–145)

## 2015-04-09 LAB — LIPID PANEL
Cholesterol: 149 mg/dL (ref 0–200)
HDL: 46.8 mg/dL (ref >39.00)
LDL Cholesterol: 63 mg/dL (ref 0–99)
NONHDL: 101.97
Total CHOL/HDL Ratio: 3
Triglycerides: 196 mg/dL — ABNORMAL HIGH (ref 0.0–149.0)
VLDL: 39.2 mg/dL (ref 0.0–40.0)

## 2015-04-09 LAB — HEPATITIS C ANTIBODY: HCV Ab: NEGATIVE

## 2015-04-10 ENCOUNTER — Other Ambulatory Visit: Payer: Self-pay | Admitting: Internal Medicine

## 2015-04-10 DIAGNOSIS — R739 Hyperglycemia, unspecified: Secondary | ICD-10-CM

## 2015-04-10 NOTE — Progress Notes (Signed)
Orders placed for f/u labs.  

## 2015-04-11 ENCOUNTER — Encounter: Payer: Self-pay | Admitting: *Deleted

## 2015-04-17 ENCOUNTER — Telehealth: Payer: Self-pay

## 2015-04-17 MED ORDER — CILIDINIUM-CHLORDIAZEPOXIDE 2.5-5 MG PO CAPS: 1.0000 | ORAL_CAPSULE | Freq: Three times a day (TID) | ORAL | Status: DC | PRN

## 2015-04-17 NOTE — Telephone Encounter (Signed)
Refill Librax

## 2015-05-12 ENCOUNTER — Other Ambulatory Visit (INDEPENDENT_AMBULATORY_CARE_PROVIDER_SITE_OTHER): Payer: BLUE CROSS/BLUE SHIELD

## 2015-05-12 DIAGNOSIS — R739 Hyperglycemia, unspecified: Secondary | ICD-10-CM

## 2015-05-12 LAB — GLUCOSE, FASTING: GLUCOSE, FASTING: 90 mg/dL (ref 65–99)

## 2015-05-12 LAB — HEMOGLOBIN A1C: Hgb A1c MFr Bld: 5.9 % (ref 4.6–6.5)

## 2015-05-13 ENCOUNTER — Encounter: Payer: Self-pay | Admitting: Internal Medicine

## 2015-05-15 NOTE — Telephone Encounter (Signed)
Unread mychart message mailed  

## 2015-05-19 ENCOUNTER — Other Ambulatory Visit: Payer: Self-pay | Admitting: Internal Medicine

## 2015-05-19 NOTE — Telephone Encounter (Signed)
Per last medication note, she was given 4 months total.  She should have another refill.

## 2015-05-19 NOTE — Telephone Encounter (Signed)
Medication refilled in October with three refills. Please advise?

## 2015-06-02 ENCOUNTER — Telehealth: Payer: Self-pay

## 2015-06-02 NOTE — Telephone Encounter (Signed)
PA for Chlordiazepoxide-clidinium completed on Cover my meds.

## 2015-06-17 ENCOUNTER — Encounter: Payer: Self-pay | Admitting: Internal Medicine

## 2015-06-17 ENCOUNTER — Ambulatory Visit (INDEPENDENT_AMBULATORY_CARE_PROVIDER_SITE_OTHER): Payer: BLUE CROSS/BLUE SHIELD | Admitting: Internal Medicine

## 2015-06-17 VITALS — BP 120/80 | HR 82 | Temp 98.4°F | Resp 18 | Ht 60.0 in | Wt 166.0 lb

## 2015-06-17 DIAGNOSIS — F439 Reaction to severe stress, unspecified: Secondary | ICD-10-CM

## 2015-06-17 DIAGNOSIS — Z86010 Personal history of colonic polyps: Secondary | ICD-10-CM

## 2015-06-17 DIAGNOSIS — N2889 Other specified disorders of kidney and ureter: Secondary | ICD-10-CM

## 2015-06-17 DIAGNOSIS — Z658 Other specified problems related to psychosocial circumstances: Secondary | ICD-10-CM | POA: Diagnosis not present

## 2015-06-17 DIAGNOSIS — K219 Gastro-esophageal reflux disease without esophagitis: Secondary | ICD-10-CM | POA: Diagnosis not present

## 2015-06-17 DIAGNOSIS — R739 Hyperglycemia, unspecified: Secondary | ICD-10-CM

## 2015-06-17 DIAGNOSIS — E7800 Pure hypercholesterolemia, unspecified: Secondary | ICD-10-CM

## 2015-06-17 DIAGNOSIS — G479 Sleep disorder, unspecified: Secondary | ICD-10-CM

## 2015-06-17 DIAGNOSIS — K589 Irritable bowel syndrome without diarrhea: Secondary | ICD-10-CM | POA: Diagnosis not present

## 2015-06-17 MED ORDER — HYDROXYZINE PAMOATE 50 MG PO CAPS: 50.0000 mg | ORAL_CAPSULE | Freq: Every evening | ORAL | Status: DC | PRN

## 2015-06-17 NOTE — Progress Notes (Signed)
Pre-visit discussion using our clinic review tool. No additional management support is needed unless otherwise documented below in the visit note.  
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
## 2015-06-18 ENCOUNTER — Encounter: Payer: Self-pay | Admitting: Internal Medicine

## 2015-06-18 NOTE — Assessment & Plan Note (Signed)
Low carb diet and exercise.  Follow met b and a1c.  a1c just checked 5.9.  Follow.

## 2015-06-18 NOTE — Assessment & Plan Note (Signed)
Vistaril as outlined.  Follow.

## 2015-06-18 NOTE — Assessment & Plan Note (Signed)
On dexilant.  Controlled.   

## 2015-06-18 NOTE — Assessment & Plan Note (Signed)
Low cholesterol diet and exercise.  LDL just checked 63.  Follow.  Triglycerides are increased.  Low carb diet.

## 2015-06-18 NOTE — Assessment & Plan Note (Signed)
Saw Dr [name].  Recommended f/u renal ultrasound in 6 months.  See Dr Bjorn Loser note for details.

## 2015-06-18 NOTE — Assessment & Plan Note (Signed)
Increased stress with anxiety as outlined.  Discussed with her today.  Wants to change to vistaril.  Will start vistaril 50mg  q hs as directed.  She will hold on trazodone.  Will hold pm dose of librax.  Follow closely.  Discussed counseling.  Agreed.  Information given.

## 2015-06-18 NOTE — Assessment & Plan Note (Signed)
Had colonoscopy.  Diagnosed with IBS.  On librax.

## 2015-06-18 NOTE — Assessment & Plan Note (Signed)
On librax.  If takes vistaril, does not need pm dose of librax.  Follow.

## 2015-06-30 ENCOUNTER — Other Ambulatory Visit: Payer: Self-pay | Admitting: Internal Medicine

## 2015-07-20 ENCOUNTER — Other Ambulatory Visit: Payer: Self-pay | Admitting: Internal Medicine

## 2015-08-14 ENCOUNTER — Telehealth: Payer: Self-pay | Admitting: Internal Medicine

## 2015-08-14 MED ORDER — HYDROXYZINE PAMOATE 50 MG PO CAPS: ORAL_CAPSULE | ORAL | Status: DC

## 2015-08-14 NOTE — Telephone Encounter (Signed)
hydrOXYzine (VISTARIL) 50 MG capsule  #90

## 2015-09-03 ENCOUNTER — Other Ambulatory Visit (INDEPENDENT_AMBULATORY_CARE_PROVIDER_SITE_OTHER): Payer: BLUE CROSS/BLUE SHIELD

## 2015-09-03 DIAGNOSIS — E7800 Pure hypercholesterolemia, unspecified: Secondary | ICD-10-CM

## 2015-09-03 DIAGNOSIS — R739 Hyperglycemia, unspecified: Secondary | ICD-10-CM | POA: Diagnosis not present

## 2015-09-03 LAB — BASIC METABOLIC PANEL
BUN: 19 mg/dL (ref 6–23)
CALCIUM: 9.5 mg/dL (ref 8.4–10.5)
CO2: 25 meq/L (ref 19–32)
Chloride: 103 mEq/L (ref 96–112)
Creatinine, Ser: 0.96 mg/dL (ref 0.40–1.20)
GFR: 63.29 mL/min (ref >60.00)
GLUCOSE: 108 mg/dL — AB (ref 70–99)
POTASSIUM: 3.6 meq/L (ref 3.5–5.1)
SODIUM: 140 meq/L (ref 135–145)

## 2015-09-03 LAB — HEPATIC FUNCTION PANEL
ALT: 45 U/L — ABNORMAL HIGH (ref 0–35)
AST: 23 U/L (ref 0–37)
Albumin: 4.8 g/dL (ref 3.5–5.2)
Alkaline Phosphatase: 58 U/L (ref 39–117)
BILIRUBIN DIRECT: 0.2 mg/dL (ref 0.0–0.3)
BILIRUBIN TOTAL: 0.9 mg/dL (ref 0.2–1.2)
Total Protein: 7.5 g/dL (ref 6.0–8.3)

## 2015-09-03 LAB — LIPID PANEL
CHOL/HDL RATIO: 4
Cholesterol: 173 mg/dL (ref 0–200)
HDL: 44.9 mg/dL (ref >39.00)
LDL CALC: 98 mg/dL (ref 0–99)
NONHDL: 128.12
Triglycerides: 153 mg/dL — ABNORMAL HIGH (ref 0.0–149.0)
VLDL: 30.6 mg/dL (ref 0.0–40.0)

## 2015-09-03 LAB — TSH: TSH: 5.33 u[IU]/mL — ABNORMAL HIGH (ref 0.35–4.50)

## 2015-09-03 LAB — HEMOGLOBIN A1C: HEMOGLOBIN A1C: 6 % (ref 4.6–6.5)

## 2015-09-07 ENCOUNTER — Encounter: Payer: Self-pay | Admitting: Internal Medicine

## 2015-09-09 ENCOUNTER — Ambulatory Visit (INDEPENDENT_AMBULATORY_CARE_PROVIDER_SITE_OTHER): Payer: BLUE CROSS/BLUE SHIELD | Admitting: Internal Medicine

## 2015-09-09 ENCOUNTER — Encounter: Payer: Self-pay | Admitting: Internal Medicine

## 2015-09-09 VITALS — BP 120/80 | HR 81 | Temp 97.8°F | Resp 18 | Ht 60.0 in | Wt 156.8 lb

## 2015-09-09 DIAGNOSIS — K589 Irritable bowel syndrome without diarrhea: Secondary | ICD-10-CM

## 2015-09-09 DIAGNOSIS — R7989 Other specified abnormal findings of blood chemistry: Secondary | ICD-10-CM

## 2015-09-09 DIAGNOSIS — R739 Hyperglycemia, unspecified: Secondary | ICD-10-CM

## 2015-09-09 DIAGNOSIS — R946 Abnormal results of thyroid function studies: Secondary | ICD-10-CM

## 2015-09-09 DIAGNOSIS — Z658 Other specified problems related to psychosocial circumstances: Secondary | ICD-10-CM

## 2015-09-09 DIAGNOSIS — E7800 Pure hypercholesterolemia, unspecified: Secondary | ICD-10-CM

## 2015-09-09 DIAGNOSIS — F439 Reaction to severe stress, unspecified: Secondary | ICD-10-CM

## 2015-09-09 DIAGNOSIS — N2889 Other specified disorders of kidney and ureter: Secondary | ICD-10-CM | POA: Diagnosis not present

## 2015-09-09 DIAGNOSIS — K219 Gastro-esophageal reflux disease without esophagitis: Secondary | ICD-10-CM

## 2015-09-09 MED ORDER — HYDROXYZINE PAMOATE 50 MG PO CAPS: ORAL_CAPSULE | ORAL | Status: DC

## 2015-09-09 MED ORDER — CILIDINIUM-CHLORDIAZEPOXIDE 2.5-5 MG PO CAPS
1.0000 | ORAL_CAPSULE | Freq: Three times a day (TID) | ORAL | Status: DC | PRN
Start: 2015-09-09 — End: 2016-01-14

## 2015-09-09 NOTE — Progress Notes (Signed)
Pre-visit discussion using our clinic review tool. No additional management support is needed unless otherwise documented below in the visit note.  
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
## 2015-09-21 ENCOUNTER — Encounter: Payer: Self-pay | Admitting: Internal Medicine

## 2015-09-21 NOTE — Assessment & Plan Note (Signed)
Followed by Dr [name].  Recommended f/u renal ultrasound in 6 months.  F/u scheduled.

## 2015-09-21 NOTE — Assessment & Plan Note (Signed)
On librax.  Worked up by GI and they recommended librax.  Stable.

## 2015-09-21 NOTE — Assessment & Plan Note (Signed)
Low cholesterol diet and exercise.  Recent labs - LDL 98.  Follow.

## 2015-09-21 NOTE — Assessment & Plan Note (Signed)
Low carb diet and exercise.  a1c just checked 6.0.  Follow met b and a1c.

## 2015-09-21 NOTE — Assessment & Plan Note (Signed)
Increased stress as outlined.  Off trazodone.  Sleeping better with vistaril.  Taking twice a day now with the increased stress.  Will continue short term.  Follow.  Decrease as able.

## 2015-09-21 NOTE — Assessment & Plan Note (Signed)
On dexilant.  Controlled.   

## 2015-11-04 DIAGNOSIS — N289 Disorder of kidney and ureter, unspecified: Secondary | ICD-10-CM | POA: Diagnosis not present

## 2015-11-04 DIAGNOSIS — N2889 Other specified disorders of kidney and ureter: Secondary | ICD-10-CM | POA: Diagnosis not present

## 2015-11-04 DIAGNOSIS — N200 Calculus of kidney: Secondary | ICD-10-CM | POA: Diagnosis not present

## 2015-11-04 DIAGNOSIS — N281 Cyst of kidney, acquired: Secondary | ICD-10-CM | POA: Diagnosis not present

## 2015-11-05 DIAGNOSIS — N281 Cyst of kidney, acquired: Secondary | ICD-10-CM | POA: Diagnosis not present

## 2015-11-05 DIAGNOSIS — R3129 Other microscopic hematuria: Secondary | ICD-10-CM | POA: Diagnosis not present

## 2015-11-05 DIAGNOSIS — Z6830 Body mass index (BMI) 30.0-30.9, adult: Secondary | ICD-10-CM | POA: Diagnosis not present

## 2015-11-05 DIAGNOSIS — N200 Calculus of kidney: Secondary | ICD-10-CM | POA: Diagnosis not present

## 2015-11-23 ENCOUNTER — Other Ambulatory Visit (INDEPENDENT_AMBULATORY_CARE_PROVIDER_SITE_OTHER): Payer: BLUE CROSS/BLUE SHIELD

## 2015-11-23 DIAGNOSIS — R946 Abnormal results of thyroid function studies: Secondary | ICD-10-CM

## 2015-11-23 DIAGNOSIS — R7989 Other specified abnormal findings of blood chemistry: Secondary | ICD-10-CM

## 2015-11-23 LAB — TSH: TSH: 6.15 u[IU]/mL — AB (ref 0.35–4.50)

## 2015-11-25 ENCOUNTER — Encounter: Payer: Self-pay | Admitting: Internal Medicine

## 2015-11-25 ENCOUNTER — Other Ambulatory Visit: Payer: BLUE CROSS/BLUE SHIELD

## 2015-12-02 ENCOUNTER — Encounter: Payer: Self-pay | Admitting: Internal Medicine

## 2015-12-02 ENCOUNTER — Ambulatory Visit (INDEPENDENT_AMBULATORY_CARE_PROVIDER_SITE_OTHER): Payer: BLUE CROSS/BLUE SHIELD | Admitting: Internal Medicine

## 2015-12-02 VITALS — BP 118/80 | HR 87 | Temp 97.6°F | Resp 18 | Ht 60.0 in | Wt 160.5 lb

## 2015-12-02 DIAGNOSIS — R946 Abnormal results of thyroid function studies: Secondary | ICD-10-CM

## 2015-12-02 DIAGNOSIS — N2889 Other specified disorders of kidney and ureter: Secondary | ICD-10-CM

## 2015-12-02 DIAGNOSIS — R739 Hyperglycemia, unspecified: Secondary | ICD-10-CM | POA: Diagnosis not present

## 2015-12-02 DIAGNOSIS — K219 Gastro-esophageal reflux disease without esophagitis: Secondary | ICD-10-CM

## 2015-12-02 DIAGNOSIS — K589 Irritable bowel syndrome without diarrhea: Secondary | ICD-10-CM

## 2015-12-02 DIAGNOSIS — R109 Unspecified abdominal pain: Secondary | ICD-10-CM

## 2015-12-02 DIAGNOSIS — G479 Sleep disorder, unspecified: Secondary | ICD-10-CM

## 2015-12-02 DIAGNOSIS — E7800 Pure hypercholesterolemia, unspecified: Secondary | ICD-10-CM

## 2015-12-02 DIAGNOSIS — F439 Reaction to severe stress, unspecified: Secondary | ICD-10-CM

## 2015-12-02 DIAGNOSIS — R7989 Other specified abnormal findings of blood chemistry: Secondary | ICD-10-CM

## 2015-12-02 DIAGNOSIS — Z658 Other specified problems related to psychosocial circumstances: Secondary | ICD-10-CM
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
## 2015-12-02 NOTE — Progress Notes (Signed)
Pre-visit discussion using our clinic review tool. No additional management support is needed unless otherwise documented below in the visit note.  

## 2015-12-03 ENCOUNTER — Encounter: Payer: Self-pay | Admitting: Internal Medicine

## 2015-12-03 NOTE — Assessment & Plan Note (Signed)
On librax.  Worked up by GI and they recommended librax.  She feels is stable.

## 2015-12-03 NOTE — Assessment & Plan Note (Signed)
Followed by Dr [name].  Recommended f/u renal ultrasound in 6 months.

## 2015-12-03 NOTE — Assessment & Plan Note (Signed)
Controlled on dexilant.   

## 2015-12-03 NOTE — Assessment & Plan Note (Signed)
Low cholesterol diet and exericse.  On lipitor.  Follow lipid panel and liver function tests.   Lab Results  Component Value Date   CHOL 173 09/03/2015   HDL 44.90 09/03/2015   LDLCALC 98 09/03/2015   LDLDIRECT 180.5 02/24/2014   TRIG 153.0 (H) 09/03/2015   CHOLHDL 4 09/03/2015

## 2015-12-03 NOTE — Assessment & Plan Note (Signed)
Low carb diet and exercise.  Follow met b and a1c.  

## 2015-12-03 NOTE — Assessment & Plan Note (Signed)
Sleeping better.  On vistaril.

## 2015-12-03 NOTE — Assessment & Plan Note (Signed)
She feel is related to the cyst.  Discussed further w/up. She declines.  Follow.

## 2015-12-03 NOTE — Assessment & Plan Note (Signed)
Increased stress.  Discussed with her today.  She does not feel needs any further intervention at this time.  Follow.   

## 2015-12-22 ENCOUNTER — Other Ambulatory Visit: Payer: Self-pay | Admitting: Internal Medicine

## 2015-12-31 ENCOUNTER — Other Ambulatory Visit: Payer: Self-pay | Admitting: Internal Medicine

## 2016-01-08 ENCOUNTER — Other Ambulatory Visit: Payer: Self-pay

## 2016-01-14 ENCOUNTER — Other Ambulatory Visit: Payer: Self-pay

## 2016-01-14 MED ORDER — CILIDINIUM-CHLORDIAZEPOXIDE 2.5-5 MG PO CAPS: 1.0000 | ORAL_CAPSULE | Freq: Three times a day (TID) | ORAL | 1 refills | Status: DC | PRN

## 2016-02-03 ENCOUNTER — Telehealth: Payer: Self-pay | Admitting: Cardiovascular Disease

## 2016-02-03 NOTE — Telephone Encounter (Signed)
3 attempts to schedule fu from recall list.  Deleting recall.  °

## 2016-03-14 ENCOUNTER — Other Ambulatory Visit: Payer: Self-pay | Admitting: Internal Medicine

## 2016-04-01 ENCOUNTER — Other Ambulatory Visit (INDEPENDENT_AMBULATORY_CARE_PROVIDER_SITE_OTHER): Payer: BLUE CROSS/BLUE SHIELD

## 2016-04-01 DIAGNOSIS — R946 Abnormal results of thyroid function studies: Secondary | ICD-10-CM

## 2016-04-01 DIAGNOSIS — N2889 Other specified disorders of kidney and ureter: Secondary | ICD-10-CM

## 2016-04-01 DIAGNOSIS — R739 Hyperglycemia, unspecified: Secondary | ICD-10-CM | POA: Diagnosis not present

## 2016-04-01 DIAGNOSIS — E7800 Pure hypercholesterolemia, unspecified: Secondary | ICD-10-CM | POA: Diagnosis not present

## 2016-04-01 DIAGNOSIS — R7989 Other specified abnormal findings of blood chemistry: Secondary | ICD-10-CM

## 2016-04-01 LAB — HEPATIC FUNCTION PANEL
ALBUMIN: 4.7 g/dL (ref 3.5–5.2)
ALK PHOS: 64 U/L (ref 39–117)
ALT: 34 U/L (ref 0–35)
AST: 20 U/L (ref 0–37)
BILIRUBIN DIRECT: 0.1 mg/dL (ref 0.0–0.3)
TOTAL PROTEIN: 7.1 g/dL (ref 6.0–8.3)
Total Bilirubin: 0.5 mg/dL (ref 0.2–1.2)

## 2016-04-01 LAB — CBC WITH DIFFERENTIAL/PLATELET
BASOS ABS: 0 10*3/uL (ref 0.0–0.1)
Basophils Relative: 0.6 % (ref 0.0–3.0)
EOS ABS: 0.2 10*3/uL (ref 0.0–0.7)
Eosinophils Relative: 3.4 % (ref 0.0–5.0)
HCT: 43 % (ref 36.0–46.0)
Hemoglobin: 14.7 g/dL (ref 12.0–15.0)
LYMPHS ABS: 2.4 10*3/uL (ref 0.7–4.0)
Lymphocytes Relative: 37 % (ref 12.0–46.0)
MCHC: 34.1 g/dL (ref 30.0–36.0)
MCV: 87.6 fl (ref 78.0–100.0)
MONO ABS: 0.5 10*3/uL (ref 0.1–1.0)
MONOS PCT: 8.1 % (ref 3.0–12.0)
NEUTROS PCT: 50.9 % (ref 43.0–77.0)
Neutro Abs: 3.3 10*3/uL (ref 1.4–7.7)
Platelets: 238 10*3/uL (ref 150.0–400.0)
RBC: 4.91 Mil/uL (ref 3.87–5.11)
RDW: 12.6 % (ref 11.5–15.5)
WBC: 6.5 10*3/uL (ref 4.0–10.5)

## 2016-04-01 LAB — BASIC METABOLIC PANEL
BUN: 17 mg/dL (ref 6–23)
CO2: 29 mEq/L (ref 19–32)
CREATININE: 0.96 mg/dL (ref 0.40–1.20)
Calcium: 9.3 mg/dL (ref 8.4–10.5)
Chloride: 106 mEq/L (ref 96–112)
GFR: 63.17 mL/min (ref >60.00)
GLUCOSE: 114 mg/dL — AB (ref 70–99)
Potassium: 4 mEq/L (ref 3.5–5.1)
Sodium: 143 mEq/L (ref 135–145)

## 2016-04-01 LAB — LIPID PANEL
CHOLESTEROL: 164 mg/dL (ref 0–200)
HDL: 43.5 mg/dL (ref >39.00)
LDL Cholesterol: 88 mg/dL (ref 0–99)
NonHDL: 120.59
TRIGLYCERIDES: 162 mg/dL — AB (ref 0.0–149.0)
Total CHOL/HDL Ratio: 4
VLDL: 32.4 mg/dL (ref 0.0–40.0)

## 2016-04-01 LAB — HEMOGLOBIN A1C: Hgb A1c MFr Bld: 5.7 % (ref 4.6–6.5)

## 2016-04-01 LAB — TSH: TSH: 5.78 u[IU]/mL — AB (ref 0.35–4.50)

## 2016-04-02 ENCOUNTER — Encounter: Payer: Self-pay | Admitting: Internal Medicine

## 2016-04-06 ENCOUNTER — Encounter: Payer: Self-pay | Admitting: Internal Medicine

## 2016-04-06 ENCOUNTER — Ambulatory Visit (INDEPENDENT_AMBULATORY_CARE_PROVIDER_SITE_OTHER): Payer: BLUE CROSS/BLUE SHIELD | Admitting: Internal Medicine

## 2016-04-06 VITALS — BP 122/88 | HR 88 | Ht 59.5 in | Wt 159.0 lb

## 2016-04-06 DIAGNOSIS — R739 Hyperglycemia, unspecified: Secondary | ICD-10-CM

## 2016-04-06 DIAGNOSIS — R7989 Other specified abnormal findings of blood chemistry: Secondary | ICD-10-CM

## 2016-04-06 DIAGNOSIS — N2889 Other specified disorders of kidney and ureter: Secondary | ICD-10-CM

## 2016-04-06 DIAGNOSIS — Z0000 Encounter for general adult medical examination without abnormal findings: Secondary | ICD-10-CM | POA: Diagnosis not present

## 2016-04-06 DIAGNOSIS — G479 Sleep disorder, unspecified: Secondary | ICD-10-CM | POA: Diagnosis not present

## 2016-04-06 DIAGNOSIS — Z1239 Encounter for other screening for malignant neoplasm of breast: Secondary | ICD-10-CM

## 2016-04-06 DIAGNOSIS — F439 Reaction to severe stress, unspecified: Secondary | ICD-10-CM

## 2016-04-06 DIAGNOSIS — Z1231 Encounter for screening mammogram for malignant neoplasm of breast: Secondary | ICD-10-CM

## 2016-04-06 DIAGNOSIS — E7800 Pure hypercholesterolemia, unspecified: Secondary | ICD-10-CM

## 2016-04-06 DIAGNOSIS — R946 Abnormal results of thyroid function studies: Secondary | ICD-10-CM

## 2016-04-06 DIAGNOSIS — K219 Gastro-esophageal reflux disease without esophagitis: Secondary | ICD-10-CM

## 2016-04-06 DIAGNOSIS — K589 Irritable bowel syndrome without diarrhea: Secondary | ICD-10-CM

## 2016-04-06 MED ORDER — CILIDINIUM-CHLORDIAZEPOXIDE 2.5-5 MG PO CAPS: 1.0000 | ORAL_CAPSULE | Freq: Three times a day (TID) | ORAL | 1 refills | Status: DC | PRN
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
## 2016-04-20 ENCOUNTER — Encounter: Payer: Self-pay | Admitting: Internal Medicine

## 2016-04-20 NOTE — Assessment & Plan Note (Signed)
Low carb diet and exercise.  Follow met b and a1c.  a1c just checked 5.7.

## 2016-04-20 NOTE — Assessment & Plan Note (Signed)
Followed by Dr [name].  Last seen 10/2015.  Recommended f/u in 6 months.  Increased stress related to this as well.  Discussed with her today.  Keep f/u with Dr [name].

## 2016-04-20 NOTE — Assessment & Plan Note (Signed)
On lipitor.  Low cholesterol diet and exercise.  Follow lipid panel and liver function tests.   

## 2016-04-20 NOTE — Assessment & Plan Note (Signed)
Increased stress as outlined.  Discussed with her today.  Discussed referral to psychiatry.  Will notify me if agreeable.  Continue current medication regimen.

## 2016-04-20 NOTE — Assessment & Plan Note (Signed)
Controlled on dexilant.   

## 2016-04-20 NOTE — Assessment & Plan Note (Signed)
Physical today 04/06/16,  S/p hysterectomy.  S/p colonoscopy.  Mammogram 12.7/16 - Birads I.  Schedule f/u mammogram.

## 2016-04-20 NOTE — Assessment & Plan Note (Signed)
Worked up by GI.  Recommended continuing [place].  She feels is stable.

## 2016-04-20 NOTE — Assessment & Plan Note (Signed)
Sleeping better on vistaril.  Follow.

## 2016-05-11 DIAGNOSIS — N281 Cyst of kidney, acquired: Secondary | ICD-10-CM | POA: Diagnosis not present

## 2016-05-11 DIAGNOSIS — R3129 Other microscopic hematuria: Secondary | ICD-10-CM | POA: Diagnosis not present

## 2016-05-11 DIAGNOSIS — Z6830 Body mass index (BMI) 30.0-30.9, adult: Secondary | ICD-10-CM | POA: Diagnosis not present

## 2016-05-11 DIAGNOSIS — K760 Fatty (change of) liver, not elsewhere classified: Secondary | ICD-10-CM | POA: Diagnosis not present

## 2016-05-11 DIAGNOSIS — N200 Calculus of kidney: Secondary | ICD-10-CM | POA: Diagnosis not present

## 2016-05-16 DIAGNOSIS — N281 Cyst of kidney, acquired: Secondary | ICD-10-CM | POA: Diagnosis not present

## 2016-05-17 DIAGNOSIS — N281 Cyst of kidney, acquired: Secondary | ICD-10-CM | POA: Diagnosis not present

## 2016-05-17 DIAGNOSIS — K760 Fatty (change of) liver, not elsewhere classified: Secondary | ICD-10-CM | POA: Diagnosis not present

## 2016-05-19 ENCOUNTER — Ambulatory Visit
Admission: RE | Admit: 2016-05-19 | Discharge: 2016-05-19 | Disposition: A | Discharge status: Home / Self Care | Payer: BLUE CROSS/BLUE SHIELD | Source: Ambulatory Visit | Attending: Internal Medicine | Admitting: Internal Medicine

## 2016-05-19 DIAGNOSIS — Z1239 Encounter for other screening for malignant neoplasm of breast: Secondary | ICD-10-CM

## 2016-05-19 DIAGNOSIS — Z1231 Encounter for screening mammogram for malignant neoplasm of breast: Secondary | ICD-10-CM | POA: Diagnosis not present

## 2016-06-23 ENCOUNTER — Other Ambulatory Visit: Payer: Self-pay | Admitting: Internal Medicine

## 2016-07-29 DIAGNOSIS — H66002 Acute suppurative otitis media without spontaneous rupture of ear drum, left ear: Secondary | ICD-10-CM | POA: Diagnosis not present

## 2016-07-29 DIAGNOSIS — J018 Other acute sinusitis: Secondary | ICD-10-CM | POA: Diagnosis not present

## 2016-08-08 ENCOUNTER — Other Ambulatory Visit: Payer: BLUE CROSS/BLUE SHIELD

## 2016-08-10 ENCOUNTER — Ambulatory Visit: Payer: BLUE CROSS/BLUE SHIELD | Admitting: Internal Medicine

## 2016-08-30 DIAGNOSIS — R109 Unspecified abdominal pain: Secondary | ICD-10-CM | POA: Diagnosis not present

## 2016-08-30 DIAGNOSIS — N398 Other specified disorders of urinary system: Secondary | ICD-10-CM | POA: Diagnosis not present

## 2016-08-30 DIAGNOSIS — R319 Hematuria, unspecified: Secondary | ICD-10-CM | POA: Diagnosis not present

## 2016-08-30 DIAGNOSIS — N281 Cyst of kidney, acquired: Secondary | ICD-10-CM | POA: Diagnosis not present

## 2016-09-05 ENCOUNTER — Other Ambulatory Visit: Payer: Self-pay | Admitting: Internal Medicine

## 2016-09-06 ENCOUNTER — Other Ambulatory Visit: Payer: Self-pay | Admitting: Internal Medicine

## 2016-09-15 DIAGNOSIS — R319 Hematuria, unspecified: Secondary | ICD-10-CM | POA: Diagnosis not present

## 2016-09-15 DIAGNOSIS — N2889 Other specified disorders of kidney and ureter: Secondary | ICD-10-CM | POA: Diagnosis not present

## 2016-09-28 ENCOUNTER — Ambulatory Visit (INDEPENDENT_AMBULATORY_CARE_PROVIDER_SITE_OTHER): Payer: BLUE CROSS/BLUE SHIELD

## 2016-09-28 ENCOUNTER — Encounter: Payer: Self-pay | Admitting: Internal Medicine

## 2016-09-28 ENCOUNTER — Ambulatory Visit (INDEPENDENT_AMBULATORY_CARE_PROVIDER_SITE_OTHER): Payer: BLUE CROSS/BLUE SHIELD | Admitting: Internal Medicine

## 2016-09-28 DIAGNOSIS — M545 Low back pain, unspecified: Secondary | ICD-10-CM

## 2016-09-28 DIAGNOSIS — Z860100 Personal history of colon polyps, unspecified: Secondary | ICD-10-CM

## 2016-09-28 DIAGNOSIS — R946 Abnormal results of thyroid function studies: Secondary | ICD-10-CM | POA: Diagnosis not present

## 2016-09-28 DIAGNOSIS — E7800 Pure hypercholesterolemia, unspecified: Secondary | ICD-10-CM

## 2016-09-28 DIAGNOSIS — R109 Unspecified abdominal pain: Secondary | ICD-10-CM

## 2016-09-28 DIAGNOSIS — K589 Irritable bowel syndrome without diarrhea: Secondary | ICD-10-CM | POA: Diagnosis not present

## 2016-09-28 DIAGNOSIS — N2889 Other specified disorders of kidney and ureter: Secondary | ICD-10-CM | POA: Diagnosis not present

## 2016-09-28 DIAGNOSIS — R7989 Other specified abnormal findings of blood chemistry: Secondary | ICD-10-CM

## 2016-09-28 DIAGNOSIS — R739 Hyperglycemia, unspecified: Secondary | ICD-10-CM

## 2016-09-28 DIAGNOSIS — R319 Hematuria, unspecified: Secondary | ICD-10-CM | POA: Diagnosis not present

## 2016-09-28 DIAGNOSIS — F439 Reaction to severe stress, unspecified: Secondary | ICD-10-CM

## 2016-09-28 DIAGNOSIS — M48061 Spinal stenosis, lumbar region without neurogenic claudication: Secondary | ICD-10-CM | POA: Diagnosis not present

## 2016-09-28 LAB — HEPATIC FUNCTION PANEL
ALBUMIN: 4.9 g/dL (ref 3.5–5.2)
ALT: 33 U/L (ref 0–35)
AST: 22 U/L (ref 0–37)
Alkaline Phosphatase: 53 U/L (ref 39–117)
BILIRUBIN TOTAL: 0.5 mg/dL (ref 0.2–1.2)
Bilirubin, Direct: 0.1 mg/dL (ref 0.0–0.3)
Total Protein: 7.5 g/dL (ref 6.0–8.3)

## 2016-09-28 LAB — BASIC METABOLIC PANEL
BUN: 15 mg/dL (ref 6–23)
CHLORIDE: 107 meq/L (ref 96–112)
CO2: 25 meq/L (ref 19–32)
CREATININE: 0.94 mg/dL (ref 0.40–1.20)
Calcium: 9.5 mg/dL (ref 8.4–10.5)
GFR: 64.61 mL/min (ref >60.00)
Glucose, Bld: 106 mg/dL — ABNORMAL HIGH (ref 70–99)
Potassium: 3.9 mEq/L (ref 3.5–5.1)
SODIUM: 140 meq/L (ref 135–145)

## 2016-09-28 LAB — LIPID PANEL
CHOLESTEROL: 166 mg/dL (ref 0–200)
HDL: 53.7 mg/dL (ref >39.00)
LDL Cholesterol: 82 mg/dL (ref 0–99)
NONHDL: 112.14
Total CHOL/HDL Ratio: 3
Triglycerides: 151 mg/dL — ABNORMAL HIGH (ref 0.0–149.0)
VLDL: 30.2 mg/dL (ref 0.0–40.0)

## 2016-09-28 LAB — HEMOGLOBIN A1C: HEMOGLOBIN A1C: 5.9 % (ref 4.6–6.5)

## 2016-09-28 LAB — TSH: TSH: 3.1 u[IU]/mL (ref 0.35–4.50)
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
## 2016-09-28 NOTE — Progress Notes (Signed)
Pre-visit discussion using our clinic review tool. No additional management support is needed unless otherwise documented below in the visit note.  

## 2016-09-29 ENCOUNTER — Encounter: Payer: Self-pay | Admitting: Internal Medicine

## 2016-09-30 ENCOUNTER — Other Ambulatory Visit: Payer: Self-pay | Admitting: Internal Medicine

## 2016-09-30 ENCOUNTER — Encounter: Payer: Self-pay | Admitting: Internal Medicine

## 2016-09-30 DIAGNOSIS — M545 Low back pain, unspecified: Secondary | ICD-10-CM

## 2016-09-30 NOTE — Assessment & Plan Note (Signed)
Has a documented history of IBS.  Worked up by GI.  States due colonoscopy.  Will refer to Dr [facility] in [place].

## 2016-09-30 NOTE — Progress Notes (Signed)
Order placed for physical therapy referral.

## 2016-09-30 NOTE — Assessment & Plan Note (Signed)
Low carb diet and exercise.  Follow met b and a1c.   

## 2016-09-30 NOTE — Assessment & Plan Note (Signed)
Has been followed by Dr [name].  Recently evaluated at [facility].  See note.  Cystoscopy - ok.  Had questions regarding further treatment.  She plans to discuss with Dr [name].

## 2016-09-30 NOTE — Assessment & Plan Note (Signed)
On lipitor.  Low cholesterol diet and exercise.  Follow lipid panel and liver function tests.   

## 2016-09-30 NOTE — Assessment & Plan Note (Signed)
Back pain and pain that radiates around lower abdomen.  Has seen urology as outlined.  Felt to be more msk.  Check L-S spine xray.  Further w/up pending results.  Check urinalysis.

## 2016-09-30 NOTE — Assessment & Plan Note (Signed)
Increased stress as outlined.  Discussed with her today.  She does not feel she needs anything more at this time.  Follow.   

## 2016-09-30 NOTE — Assessment & Plan Note (Addendum)
Previously worked up by GI.  States due colonoscopy.  Refer back to GI.

## 2016-10-17 ENCOUNTER — Ambulatory Visit: Payer: BLUE CROSS/BLUE SHIELD | Attending: Internal Medicine

## 2016-10-17 DIAGNOSIS — M545 Low back pain, unspecified: Secondary | ICD-10-CM

## 2016-10-17 DIAGNOSIS — M6281 Muscle weakness (generalized): Secondary | ICD-10-CM | POA: Diagnosis not present
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
## 2016-10-18 DIAGNOSIS — N200 Calculus of kidney: Secondary | ICD-10-CM | POA: Diagnosis not present

## 2016-10-18 DIAGNOSIS — K429 Umbilical hernia without obstruction or gangrene: Secondary | ICD-10-CM | POA: Diagnosis not present

## 2016-10-18 DIAGNOSIS — K760 Fatty (change of) liver, not elsewhere classified: Secondary | ICD-10-CM | POA: Diagnosis not present

## 2016-10-18 DIAGNOSIS — R1032 Left lower quadrant pain: Secondary | ICD-10-CM | POA: Diagnosis not present

## 2016-10-18 DIAGNOSIS — K5900 Constipation, unspecified: Secondary | ICD-10-CM | POA: Diagnosis not present

## 2016-10-18 DIAGNOSIS — K5730 Diverticulosis of large intestine without perforation or abscess without bleeding: Secondary | ICD-10-CM | POA: Diagnosis not present

## 2016-10-18 DIAGNOSIS — K589 Irritable bowel syndrome without diarrhea: Secondary | ICD-10-CM | POA: Diagnosis not present

## 2016-10-18 DIAGNOSIS — K5790 Diverticulosis of intestine, part unspecified, without perforation or abscess without bleeding: Secondary | ICD-10-CM | POA: Diagnosis not present

## 2016-10-19 ENCOUNTER — Ambulatory Visit: Payer: BLUE CROSS/BLUE SHIELD

## 2016-10-25 ENCOUNTER — Ambulatory Visit: Payer: BLUE CROSS/BLUE SHIELD

## 2016-11-02 DIAGNOSIS — Z6830 Body mass index (BMI) 30.0-30.9, adult: Secondary | ICD-10-CM | POA: Diagnosis not present

## 2016-11-02 DIAGNOSIS — N83202 Unspecified ovarian cyst, left side: Secondary | ICD-10-CM | POA: Diagnosis not present

## 2016-11-02 DIAGNOSIS — R319 Hematuria, unspecified: Secondary | ICD-10-CM | POA: Diagnosis not present

## 2016-11-02 DIAGNOSIS — R1032 Left lower quadrant pain: Secondary | ICD-10-CM | POA: Diagnosis not present

## 2016-11-04 DIAGNOSIS — R310 Gross hematuria: Secondary | ICD-10-CM | POA: Insufficient documentation

## 2016-11-07 DIAGNOSIS — N83202 Unspecified ovarian cyst, left side: Secondary | ICD-10-CM | POA: Diagnosis not present

## 2016-11-08 ENCOUNTER — Ambulatory Visit: Payer: BLUE CROSS/BLUE SHIELD | Attending: Internal Medicine

## 2016-11-08 DIAGNOSIS — R310 Gross hematuria: Secondary | ICD-10-CM | POA: Diagnosis not present

## 2016-11-08 DIAGNOSIS — N200 Calculus of kidney: Secondary | ICD-10-CM | POA: Diagnosis not present

## 2016-11-08 DIAGNOSIS — N23 Unspecified renal colic: Secondary | ICD-10-CM | POA: Diagnosis not present

## 2016-11-08 DIAGNOSIS — Z6830 Body mass index (BMI) 30.0-30.9, adult: Secondary | ICD-10-CM | POA: Diagnosis not present

## 2016-11-10 ENCOUNTER — Ambulatory Visit: Payer: BLUE CROSS/BLUE SHIELD

## 2016-11-15 ENCOUNTER — Ambulatory Visit: Payer: BLUE CROSS/BLUE SHIELD

## 2016-11-16 ENCOUNTER — Ambulatory Visit: Payer: BLUE CROSS/BLUE SHIELD

## 2016-11-16 DIAGNOSIS — N200 Calculus of kidney: Secondary | ICD-10-CM | POA: Diagnosis not present

## 2016-11-21 DIAGNOSIS — Z885 Allergy status to narcotic agent status: Secondary | ICD-10-CM | POA: Diagnosis not present

## 2016-11-21 DIAGNOSIS — Z7982 Long term (current) use of aspirin: Secondary | ICD-10-CM | POA: Diagnosis not present

## 2016-11-21 DIAGNOSIS — Z87442 Personal history of urinary calculi: Secondary | ICD-10-CM | POA: Diagnosis not present

## 2016-11-21 DIAGNOSIS — K5790 Diverticulosis of intestine, part unspecified, without perforation or abscess without bleeding: Secondary | ICD-10-CM | POA: Diagnosis not present

## 2016-11-21 DIAGNOSIS — K589 Irritable bowel syndrome without diarrhea: Secondary | ICD-10-CM | POA: Diagnosis not present

## 2016-11-21 DIAGNOSIS — R42 Dizziness and giddiness: Secondary | ICD-10-CM | POA: Diagnosis not present

## 2016-11-21 DIAGNOSIS — E7800 Pure hypercholesterolemia, unspecified: Secondary | ICD-10-CM | POA: Diagnosis not present

## 2016-11-21 DIAGNOSIS — M5416 Radiculopathy, lumbar region: Secondary | ICD-10-CM | POA: Diagnosis not present

## 2016-11-21 DIAGNOSIS — Z888 Allergy status to other drugs, medicaments and biological substances status: Secondary | ICD-10-CM | POA: Diagnosis not present

## 2016-11-21 DIAGNOSIS — N281 Cyst of kidney, acquired: Secondary | ICD-10-CM | POA: Diagnosis not present

## 2016-11-21 DIAGNOSIS — R319 Hematuria, unspecified: Secondary | ICD-10-CM | POA: Diagnosis not present

## 2016-11-21 DIAGNOSIS — Z886 Allergy status to analgesic agent status: Secondary | ICD-10-CM | POA: Diagnosis not present

## 2016-11-21 DIAGNOSIS — N200 Calculus of kidney: Secondary | ICD-10-CM | POA: Diagnosis not present

## 2016-11-21 DIAGNOSIS — R3129 Other microscopic hematuria: Secondary | ICD-10-CM | POA: Diagnosis not present

## 2016-11-21 DIAGNOSIS — R3914 Feeling of incomplete bladder emptying: Secondary | ICD-10-CM | POA: Diagnosis not present

## 2016-11-30 DIAGNOSIS — N200 Calculus of kidney: Secondary | ICD-10-CM | POA: Diagnosis not present

## 2016-11-30 DIAGNOSIS — T191XXA Foreign body in bladder, initial encounter: Secondary | ICD-10-CM | POA: Diagnosis not present

## 2016-12-14 ENCOUNTER — Encounter: Payer: Self-pay | Admitting: Internal Medicine

## 2016-12-14 ENCOUNTER — Ambulatory Visit (INDEPENDENT_AMBULATORY_CARE_PROVIDER_SITE_OTHER): Payer: BLUE CROSS/BLUE SHIELD | Admitting: Internal Medicine

## 2016-12-14 ENCOUNTER — Other Ambulatory Visit (INDEPENDENT_AMBULATORY_CARE_PROVIDER_SITE_OTHER): Payer: BLUE CROSS/BLUE SHIELD

## 2016-12-14 VITALS — BP 124/82 | HR 93 | Temp 97.9°F | Resp 14 | Wt 150.0 lb

## 2016-12-14 DIAGNOSIS — N2889 Other specified disorders of kidney and ureter: Secondary | ICD-10-CM | POA: Diagnosis not present

## 2016-12-14 DIAGNOSIS — R109 Unspecified abdominal pain: Secondary | ICD-10-CM

## 2016-12-14 DIAGNOSIS — N200 Calculus of kidney: Secondary | ICD-10-CM

## 2016-12-14 DIAGNOSIS — F439 Reaction to severe stress, unspecified: Secondary | ICD-10-CM | POA: Diagnosis not present

## 2016-12-14 DIAGNOSIS — E7800 Pure hypercholesterolemia, unspecified: Secondary | ICD-10-CM

## 2016-12-14 DIAGNOSIS — K589 Irritable bowel syndrome without diarrhea: Secondary | ICD-10-CM

## 2016-12-14 DIAGNOSIS — R739 Hyperglycemia, unspecified: Secondary | ICD-10-CM

## 2016-12-14 DIAGNOSIS — Z86010 Personal history of colonic polyps: Secondary | ICD-10-CM

## 2016-12-14 LAB — URINALYSIS, ROUTINE W REFLEX MICROSCOPIC
Ketones, ur: NEGATIVE
LEUKOCYTES UA: NEGATIVE
NITRITE: NEGATIVE
Specific Gravity, Urine: 1.035 — AB (ref 1.000–1.030)
URINE GLUCOSE: NEGATIVE
Urobilinogen, UA: 0.2 — AB (ref 0.0–1.0)
pH: 6 (ref 5.0–8.0)

## 2016-12-14 NOTE — Progress Notes (Signed)
Pre-visit discussion using our clinic review tool. No additional management support is needed unless otherwise documented below in the visit note.  
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
## 2016-12-15 ENCOUNTER — Telehealth: Payer: Self-pay | Admitting: *Deleted

## 2016-12-15 ENCOUNTER — Encounter: Payer: Self-pay | Admitting: Internal Medicine

## 2016-12-15 NOTE — Assessment & Plan Note (Addendum)
Has been evaluated by GI.  Just evaluated /2018.  Ordered CT.  Has a documented history of IBS.  Needs f/u.

## 2016-12-15 NOTE — Assessment & Plan Note (Signed)
On lipitor.  Low cholesterol diet and exercise.  Follow lipid panel and liver function tests.   

## 2016-12-15 NOTE — Assessment & Plan Note (Signed)
She is followed by Dr [name].

## 2016-12-15 NOTE — Telephone Encounter (Signed)
See lab results for documentation.  

## 2016-12-15 NOTE — Assessment & Plan Note (Signed)
Low carb diet and exercise.  Follow met b and a1c.   

## 2016-12-15 NOTE — Assessment & Plan Note (Signed)
Recently evaluated by [facility].  Ordered CT.  Plan for f/u.

## 2016-12-15 NOTE — Assessment & Plan Note (Signed)
S/p removal of left renal calculus.  Left flank pain improved.  Has more stones as outlined.  Persistent intermittent discomfort with some decreased appetite.  She plans to f/u with urology to discuss her ongoing issues.  Requested f/u urinalysis today.

## 2016-12-15 NOTE — Telephone Encounter (Signed)
Pt missed a call from this office  Pt contact [phone]

## 2016-12-15 NOTE — Assessment & Plan Note (Signed)
Increased stress.  Discussed with her today.  She feels she is handling things relatively well.  Follow.   

## 2016-12-16 ENCOUNTER — Other Ambulatory Visit: Payer: Self-pay | Admitting: Internal Medicine

## 2017-01-09 ENCOUNTER — Other Ambulatory Visit: Payer: Self-pay | Admitting: Internal Medicine

## 2017-01-09 DIAGNOSIS — R102 Pelvic and perineal pain: Secondary | ICD-10-CM | POA: Diagnosis not present

## 2017-01-09 DIAGNOSIS — N200 Calculus of kidney: Secondary | ICD-10-CM | POA: Diagnosis not present

## 2017-01-13 DIAGNOSIS — N200 Calculus of kidney: Secondary | ICD-10-CM | POA: Diagnosis not present

## 2017-01-13 DIAGNOSIS — K5730 Diverticulosis of large intestine without perforation or abscess without bleeding: Secondary | ICD-10-CM | POA: Diagnosis not present

## 2017-01-13 DIAGNOSIS — N23 Unspecified renal colic: Secondary | ICD-10-CM | POA: Diagnosis not present

## 2017-01-13 DIAGNOSIS — N132 Hydronephrosis with renal and ureteral calculous obstruction: Secondary | ICD-10-CM | POA: Diagnosis not present

## 2017-01-13 DIAGNOSIS — K760 Fatty (change of) liver, not elsewhere classified: Secondary | ICD-10-CM | POA: Diagnosis not present

## 2017-02-20 DIAGNOSIS — E7800 Pure hypercholesterolemia, unspecified: Secondary | ICD-10-CM | POA: Diagnosis not present

## 2017-02-20 DIAGNOSIS — R3129 Other microscopic hematuria: Secondary | ICD-10-CM | POA: Diagnosis not present

## 2017-02-20 DIAGNOSIS — Z79899 Other long term (current) drug therapy: Secondary | ICD-10-CM | POA: Diagnosis not present

## 2017-02-20 DIAGNOSIS — K589 Irritable bowel syndrome without diarrhea: Secondary | ICD-10-CM | POA: Diagnosis not present

## 2017-02-20 DIAGNOSIS — R3914 Feeling of incomplete bladder emptying: Secondary | ICD-10-CM | POA: Diagnosis not present

## 2017-02-20 DIAGNOSIS — Z720 Tobacco use: Secondary | ICD-10-CM | POA: Diagnosis not present

## 2017-02-20 DIAGNOSIS — M5416 Radiculopathy, lumbar region: Secondary | ICD-10-CM | POA: Diagnosis not present

## 2017-02-20 DIAGNOSIS — N281 Cyst of kidney, acquired: Secondary | ICD-10-CM | POA: Diagnosis not present

## 2017-02-20 DIAGNOSIS — Z6830 Body mass index (BMI) 30.0-30.9, adult: Secondary | ICD-10-CM | POA: Diagnosis not present

## 2017-02-20 DIAGNOSIS — N202 Calculus of kidney with calculus of ureter: Secondary | ICD-10-CM | POA: Diagnosis not present

## 2017-02-20 DIAGNOSIS — N201 Calculus of ureter: Secondary | ICD-10-CM | POA: Diagnosis not present

## 2017-02-23 ENCOUNTER — Other Ambulatory Visit: Payer: BLUE CROSS/BLUE SHIELD

## 2017-02-27 ENCOUNTER — Ambulatory Visit: Payer: BLUE CROSS/BLUE SHIELD | Admitting: Internal Medicine

## 2017-03-01 DIAGNOSIS — N200 Calculus of kidney: Secondary | ICD-10-CM | POA: Diagnosis not present

## 2017-03-01 DIAGNOSIS — T191XXA Foreign body in bladder, initial encounter: Secondary | ICD-10-CM | POA: Diagnosis not present

## 2017-03-22 ENCOUNTER — Telehealth: Payer: Self-pay

## 2017-03-22 ENCOUNTER — Other Ambulatory Visit: Payer: Self-pay | Admitting: Internal Medicine

## 2017-03-22 ENCOUNTER — Other Ambulatory Visit (INDEPENDENT_AMBULATORY_CARE_PROVIDER_SITE_OTHER): Payer: BLUE CROSS/BLUE SHIELD

## 2017-03-22 DIAGNOSIS — R739 Hyperglycemia, unspecified: Secondary | ICD-10-CM | POA: Diagnosis not present

## 2017-03-22 DIAGNOSIS — N200 Calculus of kidney: Secondary | ICD-10-CM

## 2017-03-22 DIAGNOSIS — E7800 Pure hypercholesterolemia, unspecified: Secondary | ICD-10-CM

## 2017-03-22 LAB — BASIC METABOLIC PANEL
BUN: 19 mg/dL (ref 6–23)
CHLORIDE: 103 meq/L (ref 96–112)
CO2: 31 mEq/L (ref 19–32)
Calcium: 9.9 mg/dL (ref 8.4–10.5)
Creatinine, Ser: 0.91 mg/dL (ref 0.40–1.20)
GFR: 66.97 mL/min (ref >60.00)
GLUCOSE: 104 mg/dL — AB (ref 70–99)
Potassium: 4.1 mEq/L (ref 3.5–5.1)
Sodium: 142 mEq/L (ref 135–145)

## 2017-03-22 LAB — HEPATIC FUNCTION PANEL
ALK PHOS: 62 U/L (ref 39–117)
ALT: 27 U/L (ref 0–35)
AST: 19 U/L (ref 0–37)
Albumin: 4.9 g/dL (ref 3.5–5.2)
BILIRUBIN DIRECT: 0.1 mg/dL (ref 0.0–0.3)
BILIRUBIN TOTAL: 0.7 mg/dL (ref 0.2–1.2)
Total Protein: 8 g/dL (ref 6.0–8.3)

## 2017-03-22 LAB — CBC WITH DIFFERENTIAL/PLATELET
BASOS ABS: 0 10*3/uL (ref 0.0–0.1)
Basophils Relative: 0.8 % (ref 0.0–3.0)
EOS ABS: 0.2 10*3/uL (ref 0.0–0.7)
EOS PCT: 3.1 % (ref 0.0–5.0)
HCT: 41 % (ref 36.0–46.0)
HEMOGLOBIN: 14 g/dL (ref 12.0–15.0)
Lymphocytes Relative: 36.7 % (ref 12.0–46.0)
Lymphs Abs: 1.9 10*3/uL (ref 0.7–4.0)
MCHC: 34.2 g/dL (ref 30.0–36.0)
MCV: 91.5 fl (ref 78.0–100.0)
MONO ABS: 0.4 10*3/uL (ref 0.1–1.0)
Monocytes Relative: 7 % (ref 3.0–12.0)
Neutro Abs: 2.6 10*3/uL (ref 1.4–7.7)
Neutrophils Relative %: 52.4 % (ref 43.0–77.0)
Platelets: 221 10*3/uL (ref 150.0–400.0)
RBC: 4.48 Mil/uL (ref 3.87–5.11)
RDW: 12.8 % (ref 11.5–15.5)
WBC: 5 10*3/uL (ref 4.0–10.5)

## 2017-03-22 LAB — LIPID PANEL
CHOL/HDL RATIO: 3
Cholesterol: 203 mg/dL — ABNORMAL HIGH (ref 0–200)
HDL: 59.1 mg/dL (ref >39.00)
LDL Cholesterol: 113 mg/dL — ABNORMAL HIGH (ref 0–99)
NonHDL: 144.32
Triglycerides: 155 mg/dL — ABNORMAL HIGH (ref 0.0–149.0)
VLDL: 31 mg/dL (ref 0.0–40.0)

## 2017-03-22 LAB — HEMOGLOBIN A1C: HEMOGLOBIN A1C: 5.6 % (ref 4.6–6.5)

## 2017-03-22 LAB — TSH: TSH: 2.93 u[IU]/mL (ref 0.35–4.50)

## 2017-03-22 NOTE — Telephone Encounter (Signed)
Dr.scott has placed orders and patient has already had them drawn.  Fax number to send results is in dr. Nicki Reaper inbox on side desk.

## 2017-03-22 NOTE — Progress Notes (Signed)
Order placed for intact pth 

## 2017-03-22 NOTE — Telephone Encounter (Signed)
Copied from [place] [id number]. Topic: General - Other >> Mar 22, 2017  9:18 AM Synthia Innocent wrote: Reason for CRM: Dr Brendia Sacks was to faxed over lab orders, wants to check and see if we received them and when she can come have them done >> Mar 22, 2017  9:21 AM Synthia Innocent wrote: Also is needing a hospital follow up in the next week or so, no appointments avail. Only wants to see Dr Nicki Reaper

## 2017-03-22 NOTE — Telephone Encounter (Signed)
Copied from [place] [phone]. Topic: General - Other >> Mar 22, 2017  9:18 AM Synthia Innocent wrote: Reason for CRM: Dr Brendia Sacks was to faxed over lab orders, wants to check and see if we received them and when she can come have them done >> Mar 22, 2017  9:21 AM Synthia Innocent wrote: Also is needing a hospital follow up in the next week or so, no appointments avail. Only wants to see Dr Nicki Reaper

## 2017-03-22 NOTE — Telephone Encounter (Signed)
We have yet to receive lab orders.

## 2017-03-23 ENCOUNTER — Encounter: Payer: Self-pay | Admitting: Internal Medicine

## 2017-03-23 LAB — PARATHYROID HORMONE, INTACT (NO CA): PTH: 32 pg/mL (ref 14–64)

## 2017-03-24 ENCOUNTER — Encounter: Payer: Self-pay | Admitting: Internal Medicine

## 2017-03-25 DIAGNOSIS — N200 Calculus of kidney: Secondary | ICD-10-CM | POA: Diagnosis not present

## 2017-03-28 ENCOUNTER — Telehealth: Payer: Self-pay | Admitting: Internal Medicine

## 2017-03-28 ENCOUNTER — Other Ambulatory Visit: Payer: Self-pay | Admitting: Internal Medicine

## 2017-03-28 DIAGNOSIS — K760 Fatty (change of) liver, not elsewhere classified: Secondary | ICD-10-CM | POA: Diagnosis not present

## 2017-03-28 DIAGNOSIS — N2889 Other specified disorders of kidney and ureter: Secondary | ICD-10-CM | POA: Diagnosis not present

## 2017-03-28 DIAGNOSIS — N200 Calculus of kidney: Secondary | ICD-10-CM | POA: Diagnosis not present

## 2017-03-28 DIAGNOSIS — Z87442 Personal history of urinary calculi: Secondary | ICD-10-CM | POA: Diagnosis not present

## 2017-03-28 DIAGNOSIS — M545 Low back pain: Secondary | ICD-10-CM | POA: Diagnosis not present

## 2017-03-28 DIAGNOSIS — N281 Cyst of kidney, acquired: Secondary | ICD-10-CM | POA: Diagnosis not present

## 2017-03-28 DIAGNOSIS — R109 Unspecified abdominal pain: Secondary | ICD-10-CM | POA: Diagnosis not present

## 2017-03-28 DIAGNOSIS — N398 Other specified disorders of urinary system: Secondary | ICD-10-CM | POA: Diagnosis not present

## 2017-03-28 DIAGNOSIS — K429 Umbilical hernia without obstruction or gangrene: Secondary | ICD-10-CM | POA: Diagnosis not present

## 2017-03-28 NOTE — Telephone Encounter (Signed)
Please advise 

## 2017-03-28 NOTE — Telephone Encounter (Signed)
Copied from [place] [phone]. Topic: Inquiry >> Mar 28, 2017 10:42 AM Corie Chiquito, [place] wrote: Reason for CRM:  [facility] called because they need copies of this patient lab work faxed over at [phone]  because she is getting a CT scan done today.

## 2017-03-28 NOTE — Telephone Encounter (Signed)
Paper work has been faxed

## 2017-03-29 ENCOUNTER — Encounter: Payer: Self-pay | Admitting: Internal Medicine

## 2017-04-04 ENCOUNTER — Ambulatory Visit: Payer: BLUE CROSS/BLUE SHIELD | Admitting: Internal Medicine

## 2017-04-06 ENCOUNTER — Ambulatory Visit (INDEPENDENT_AMBULATORY_CARE_PROVIDER_SITE_OTHER): Payer: BLUE CROSS/BLUE SHIELD | Admitting: Internal Medicine

## 2017-04-06 ENCOUNTER — Encounter: Payer: Self-pay | Admitting: Internal Medicine

## 2017-04-06 DIAGNOSIS — R739 Hyperglycemia, unspecified: Secondary | ICD-10-CM

## 2017-04-06 DIAGNOSIS — N83209 Unspecified ovarian cyst, unspecified side: Secondary | ICD-10-CM | POA: Diagnosis not present

## 2017-04-06 DIAGNOSIS — K589 Irritable bowel syndrome without diarrhea: Secondary | ICD-10-CM

## 2017-04-06 DIAGNOSIS — N200 Calculus of kidney: Secondary | ICD-10-CM

## 2017-04-06 DIAGNOSIS — E7800 Pure hypercholesterolemia, unspecified: Secondary | ICD-10-CM | POA: Diagnosis not present

## 2017-04-06 DIAGNOSIS — N2889 Other specified disorders of kidney and ureter: Secondary | ICD-10-CM | POA: Diagnosis not present

## 2017-04-06 DIAGNOSIS — F439 Reaction to severe stress, unspecified: Secondary | ICD-10-CM

## 2017-04-06 DIAGNOSIS — K219 Gastro-esophageal reflux disease without esophagitis: Secondary | ICD-10-CM
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
## 2017-04-09 ENCOUNTER — Encounter: Payer: Self-pay | Admitting: Internal Medicine

## 2017-04-09 DIAGNOSIS — N83209 Unspecified ovarian cyst, unspecified side: Secondary | ICD-10-CM | POA: Insufficient documentation

## 2017-04-09 NOTE — Assessment & Plan Note (Signed)
Controlled on dexilant.   

## 2017-04-09 NOTE — Assessment & Plan Note (Signed)
Being followed by Dr [name].  Has three stones on recent scan.  Planning for 24 hour urine.  She will notify me of specific test and if desires to have performed here.

## 2017-04-09 NOTE — Assessment & Plan Note (Signed)
Followed by urology.  Just evaluated at [facility].  Recommended continued monitoring by Dr [name].

## 2017-04-09 NOTE — Assessment & Plan Note (Signed)
Found on CT 09/2016.  Discussed no mention on f/u CT scans.  Discussed my desire for further w/up (i.e., pelvic ultrasound,etc).  She declines.  States had checked and desires no further scanning or testing.

## 2017-04-09 NOTE — Assessment & Plan Note (Signed)
Has been evaluated by GI.  CTs as outlined.  Overall stable.

## 2017-04-09 NOTE — Assessment & Plan Note (Signed)
On lipitor.  Follow lipid panel and liver function tests.   

## 2017-04-09 NOTE — Assessment & Plan Note (Signed)
Increased stress.  Overall appears to be handling things well.  Hold on making any changes.  Follow.

## 2017-04-09 NOTE — Assessment & Plan Note (Signed)
Low carb diet and exercise.  Follow met b and a1c.   

## 2017-06-01 ENCOUNTER — Other Ambulatory Visit: Payer: Self-pay | Admitting: Internal Medicine

## 2017-06-17 ENCOUNTER — Other Ambulatory Visit: Payer: Self-pay | Admitting: Internal Medicine

## 2017-06-28 ENCOUNTER — Other Ambulatory Visit (INDEPENDENT_AMBULATORY_CARE_PROVIDER_SITE_OTHER): Payer: BLUE CROSS/BLUE SHIELD

## 2017-06-28 ENCOUNTER — Telehealth: Payer: Self-pay | Admitting: Radiology

## 2017-06-28 DIAGNOSIS — R739 Hyperglycemia, unspecified: Secondary | ICD-10-CM | POA: Diagnosis not present

## 2017-06-28 DIAGNOSIS — E7800 Pure hypercholesterolemia, unspecified: Secondary | ICD-10-CM | POA: Diagnosis not present

## 2017-06-28 LAB — HEPATIC FUNCTION PANEL
ALT: 20 U/L (ref 0–35)
AST: 17 U/L (ref 0–37)
Albumin: 4.8 g/dL (ref 3.5–5.2)
Alkaline Phosphatase: 55 U/L (ref 39–117)
BILIRUBIN DIRECT: 0.1 mg/dL (ref 0.0–0.3)
BILIRUBIN TOTAL: 0.6 mg/dL (ref 0.2–1.2)
Total Protein: 8 g/dL (ref 6.0–8.3)

## 2017-06-28 LAB — LIPID PANEL
CHOL/HDL RATIO: 3
Cholesterol: 187 mg/dL (ref 0–200)
HDL: 56.9 mg/dL (ref >39.00)
LDL Cholesterol: 91 mg/dL (ref 0–99)
NONHDL: 130.53
Triglycerides: 200 mg/dL — ABNORMAL HIGH (ref 0.0–149.0)
VLDL: 40 mg/dL (ref 0.0–40.0)

## 2017-06-28 LAB — BASIC METABOLIC PANEL
BUN: 22 mg/dL (ref 6–23)
CHLORIDE: 95 meq/L — AB (ref 96–112)
CO2: 36 meq/L — AB (ref 19–32)
CREATININE: 1.01 mg/dL (ref 0.40–1.20)
Calcium: 10.1 mg/dL (ref 8.4–10.5)
GFR: 59.32 mL/min — ABNORMAL LOW (ref >60.00)
GLUCOSE: 110 mg/dL — AB (ref 70–99)
POTASSIUM: 2.7 meq/L — AB (ref 3.5–5.1)
Sodium: 140 mEq/L (ref 135–145)

## 2017-06-28 LAB — HEMOGLOBIN A1C: HEMOGLOBIN A1C: 5.9 % (ref 4.6–6.5)

## 2017-06-28 NOTE — Telephone Encounter (Signed)
Pt notified of potassium level.  She is on potassium and taking hctz from Dr [name].  He has her own 50mg  q day hctz.  Called Dr Cope's office to confirm medication and to see if can adjust her hctz.  Also see if we can change her potassium supplements.   They are to call me back with info.

## 2017-06-28 NOTE — Telephone Encounter (Signed)
Saa from [facility] lab called with critical lab results of potassium 2.7. Pt stated when she came in for labs today she had laser surgery to remove kidney stone. Pt stated they put her on potassium citrate and HCTZ. Please advised.

## 2017-06-29 NOTE — Telephone Encounter (Signed)
See result note.  

## 2017-06-30 ENCOUNTER — Encounter: Payer: Self-pay | Admitting: Internal Medicine

## 2017-06-30 ENCOUNTER — Other Ambulatory Visit
Admission: RE | Admit: 2017-06-30 | Discharge: 2017-06-30 | Disposition: A | Discharge status: Home / Self Care | Payer: BLUE CROSS/BLUE SHIELD | Source: Ambulatory Visit | Attending: Internal Medicine | Admitting: Internal Medicine

## 2017-06-30 ENCOUNTER — Ambulatory Visit (INDEPENDENT_AMBULATORY_CARE_PROVIDER_SITE_OTHER): Payer: BLUE CROSS/BLUE SHIELD | Admitting: Internal Medicine

## 2017-06-30 VITALS — BP 104/68 | HR 89 | Temp 99.3°F | Resp 16 | Ht 60.0 in | Wt 148.2 lb

## 2017-06-30 DIAGNOSIS — E876 Hypokalemia: Secondary | ICD-10-CM

## 2017-06-30 DIAGNOSIS — F439 Reaction to severe stress, unspecified: Secondary | ICD-10-CM | POA: Diagnosis not present

## 2017-06-30 DIAGNOSIS — R739 Hyperglycemia, unspecified: Secondary | ICD-10-CM

## 2017-06-30 DIAGNOSIS — R079 Chest pain, unspecified: Secondary | ICD-10-CM

## 2017-06-30 DIAGNOSIS — E7800 Pure hypercholesterolemia, unspecified: Secondary | ICD-10-CM | POA: Diagnosis not present

## 2017-06-30 DIAGNOSIS — K219 Gastro-esophageal reflux disease without esophagitis: Secondary | ICD-10-CM

## 2017-06-30 DIAGNOSIS — N200 Calculus of kidney: Secondary | ICD-10-CM | POA: Diagnosis not present

## 2017-06-30 DIAGNOSIS — R0789 Other chest pain: Secondary | ICD-10-CM

## 2017-06-30 LAB — BASIC METABOLIC PANEL
Anion gap: 10 (ref 5–15)
BUN: 19 mg/dL (ref 6–20)
CO2: 32 mmol/L (ref 22–32)
CREATININE: 0.99 mg/dL (ref 0.44–1.00)
Calcium: 9.3 mg/dL (ref 8.9–10.3)
Chloride: 98 mmol/L — ABNORMAL LOW (ref 101–111)
GFR calc non Af Amer: >60 mL/min (ref >60)
Glucose, Bld: 105 mg/dL — ABNORMAL HIGH (ref 65–99)
Potassium: 3.1 mmol/L — ABNORMAL LOW (ref 3.5–5.1)
SODIUM: 140 mmol/L (ref 135–145)

## 2017-06-30 LAB — FIBRIN DERIVATIVES D-DIMER (ARMC ONLY): FIBRIN DERIVATIVES D-DIMER (ARMC): 338.26 ng{FEU}/mL (ref 0.00–499.00)

## 2017-06-30 LAB — TROPONIN I

## 2017-06-30 NOTE — Patient Instructions (Signed)
Appointment Dr Rockey Situ - 07/07/17 - 2:20
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
## 2017-06-30 NOTE — Progress Notes (Signed)
Pre-visit discussion using our clinic review tool. No additional management support is needed unless otherwise documented below in the visit note.  

## 2017-07-02 ENCOUNTER — Encounter: Payer: Self-pay | Admitting: Internal Medicine

## 2017-07-02 NOTE — Assessment & Plan Note (Signed)
Controlled on current regimen.   

## 2017-07-02 NOTE — Assessment & Plan Note (Signed)
Low carb diet and exercise.  Follow met b and a1c.   

## 2017-07-02 NOTE — Assessment & Plan Note (Signed)
Describes chest tightness as outlined.  Not necessarily brought on by exercise.  Has not been exercising.  Recent procedures.  Intermittent chest tightness.  No acid reflux.  Given symptoms, EKG - SR with no acute ischemic changes.  Feel further cardiac w/up.  Check stat troponin and D dimer.  Also recheck metabolic panel.  Discussed with cardiology.  Feel if labs ok, ok for scheduled f/u with cardiology.  F/u scheduled with Dr Rockey Situ 07/07/17.  Discussed ER evaluation.

## 2017-07-02 NOTE — Assessment & Plan Note (Signed)
Increased stress.  Discussed with her today.  Does not feel need anything more at this time.  Follow.

## 2017-07-02 NOTE — Assessment & Plan Note (Signed)
Being followed by Dr [name].  Was on hctz 50mg  q day.  Will decrease to 1/2 tablet per day.  Continue potassium supplements.  Recheck metabolic panel today.

## 2017-07-02 NOTE — Assessment & Plan Note (Signed)
On lipitor.  Low cholesterol diet and exercise.  Follow lipid panel and liver function tests.   

## 2017-07-06 ENCOUNTER — Ambulatory Visit (INDEPENDENT_AMBULATORY_CARE_PROVIDER_SITE_OTHER): Payer: BLUE CROSS/BLUE SHIELD

## 2017-07-06 ENCOUNTER — Ambulatory Visit (INDEPENDENT_AMBULATORY_CARE_PROVIDER_SITE_OTHER): Payer: BLUE CROSS/BLUE SHIELD | Admitting: Internal Medicine

## 2017-07-06 VITALS — BP 130/78 | HR 93 | Temp 97.5°F | Resp 18 | Ht 60.0 in | Wt 148.6 lb

## 2017-07-06 DIAGNOSIS — E7800 Pure hypercholesterolemia, unspecified: Secondary | ICD-10-CM | POA: Diagnosis not present

## 2017-07-06 DIAGNOSIS — R739 Hyperglycemia, unspecified: Secondary | ICD-10-CM

## 2017-07-06 DIAGNOSIS — R079 Chest pain, unspecified: Secondary | ICD-10-CM

## 2017-07-06 DIAGNOSIS — E876 Hypokalemia: Secondary | ICD-10-CM | POA: Insufficient documentation

## 2017-07-06 DIAGNOSIS — Z0000 Encounter for general adult medical examination without abnormal findings: Secondary | ICD-10-CM

## 2017-07-06 DIAGNOSIS — N2889 Other specified disorders of kidney and ureter: Secondary | ICD-10-CM

## 2017-07-06 DIAGNOSIS — Z0001 Encounter for general adult medical examination with abnormal findings: Secondary | ICD-10-CM | POA: Diagnosis not present

## 2017-07-06 DIAGNOSIS — Z9109 Other allergy status, other than to drugs and biological substances: Secondary | ICD-10-CM | POA: Diagnosis not present

## 2017-07-06 DIAGNOSIS — Z1239 Encounter for other screening for malignant neoplasm of breast: Secondary | ICD-10-CM

## 2017-07-06 DIAGNOSIS — K219 Gastro-esophageal reflux disease without esophagitis: Secondary | ICD-10-CM | POA: Diagnosis not present

## 2017-07-06 DIAGNOSIS — R51 Headache: Secondary | ICD-10-CM

## 2017-07-06 DIAGNOSIS — F439 Reaction to severe stress, unspecified: Secondary | ICD-10-CM

## 2017-07-06 DIAGNOSIS — R519 Headache, unspecified: Secondary | ICD-10-CM

## 2017-07-06 DIAGNOSIS — N200 Calculus of kidney: Secondary | ICD-10-CM

## 2017-07-06 LAB — BASIC METABOLIC PANEL
BUN: 19 mg/dL (ref 6–23)
CO2: 34 meq/L — AB (ref 19–32)
Calcium: 9.8 mg/dL (ref 8.4–10.5)
Chloride: 98 mEq/L (ref 96–112)
Creatinine, Ser: 1.01 mg/dL (ref 0.40–1.20)
GFR: 59.32 mL/min — ABNORMAL LOW (ref >60.00)
GLUCOSE: 100 mg/dL — AB (ref 70–99)
POTASSIUM: 3.3 meq/L — AB (ref 3.5–5.1)
SODIUM: 139 meq/L (ref 135–145)

## 2017-07-06 LAB — URINALYSIS, ROUTINE W REFLEX MICROSCOPIC
BILIRUBIN URINE: NEGATIVE
HGB URINE DIPSTICK: NEGATIVE
Ketones, ur: NEGATIVE
NITRITE: NEGATIVE
RBC / HPF: NONE SEEN (ref 0–?)
Specific Gravity, Urine: <=1.005 — AB (ref 1.000–1.030)
Total Protein, Urine: NEGATIVE
Urine Glucose: NEGATIVE
Urobilinogen, UA: 0.2 (ref 0.0–1.0)
pH: 6.5 (ref 5.0–8.0)

## 2017-07-06 LAB — URIC ACID: URIC ACID, SERUM: 8.2 mg/dL — AB (ref 2.4–7.0)

## 2017-07-06 LAB — SEDIMENTATION RATE: SED RATE: 7 mm/h (ref 0–30)
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
## 2017-07-07 ENCOUNTER — Ambulatory Visit: Payer: BLUE CROSS/BLUE SHIELD | Admitting: Cardiovascular Disease

## 2017-07-07 ENCOUNTER — Other Ambulatory Visit: Payer: Self-pay | Admitting: Internal Medicine

## 2017-07-07 ENCOUNTER — Encounter: Payer: Self-pay | Admitting: Cardiovascular Disease

## 2017-07-07 ENCOUNTER — Encounter: Payer: Self-pay | Admitting: Internal Medicine

## 2017-07-07 VITALS — BP 110/80 | HR 86 | Ht 60.0 in | Wt 148.5 lb

## 2017-07-07 DIAGNOSIS — E7800 Pure hypercholesterolemia, unspecified: Secondary | ICD-10-CM | POA: Diagnosis not present

## 2017-07-07 DIAGNOSIS — R079 Chest pain, unspecified: Secondary | ICD-10-CM

## 2017-07-07 DIAGNOSIS — R0602 Shortness of breath: Secondary | ICD-10-CM

## 2017-07-07 DIAGNOSIS — E876 Hypokalemia: Secondary | ICD-10-CM

## 2017-07-07 DIAGNOSIS — N200 Calculus of kidney: Secondary | ICD-10-CM | POA: Diagnosis not present

## 2017-07-07 NOTE — Patient Instructions (Addendum)
Your symptoms could be secondary to liquid potassium Watch the NSAIDS  Medication Instructions:   No medication changes made  Ask urology about aldactone/spironolactone  Labwork:  No new labs needed  Testing/Procedures:  We will order a CT coronary calcium score for family hx $150   Follow-Up: It was a pleasure seeing you in the office today. Please call us if you have new issues that need to be addressed before your next appt.  [phone]  Your physician wants you to follow-up in: 12 months.  You will receive a reminder letter in the mail two months in advance. If you don't receive a letter, please call our office to schedule the follow-up appointment.  If you need a refill on your cardiac medications before your next appointment, please call your pharmacy.  For educational health videos Log in to : [email or web address] Or : [email or web address], password : [email or web address]

## 2017-07-07 NOTE — Progress Notes (Signed)
Order placed for f/u met b 

## 2017-07-07 NOTE — Progress Notes (Signed)
Cardiology Office Note  Date:  07/07/2017   ID:  Judith Rios, DOB Mar 26, 1956, MRN [id number]  PCP:  Judith Pheasant, MD   Chief Complaint  Patient presents with  . other    Ref by Dr. Nicki Rios for chest tightness. Pt. last seen in 2017. Pt. c/o having problems with increased potassium, kidney stones, chest heaviness with a stabbing pain at times. Meds reviewed by the pt. verbally.     HPI:  Last seen in clinic 08/2014 Judith Rios is a very pleasant 62 -year-old woman with a history of  Arthritis on High-dose aspirin, ibuprofen Hyperlipidemia, SOB  strong family history of coronary artery disease, father with bypass surgery at age 18, mother with strokes and heart failure.  uncontrolled hyperlipidemia for more than 20 years. Who presents for evaluation of chest tightness, shortness of breath.  She reports having a difficult hometime with kidney stones Procedure performed July 29th Repeat procedure Oct 29th Continues to have additionalKidney stones Managed by Dr. Jacqlyn Rios Now on potassium citrate 3 times a day, powder that she has to mix up With HCTZ initially 50 mg daily, recently decreased down to 25 mg daily for hypokalemia Potassium running 2.7 now trending upwards 3.3  On HCTZ and potassium past 6 weeks Feels her chest pressure developed after she started on potassium and HCTZ  No discomfort on exertion No additional stress at work  Discussed her risk factors, no smoking, no significant diabetes Cholesterol recently well controlled on statin  Previously with chest pain, Stress test at that time with no ischemia  Cholesterol in the past was 260 prior to taking a statin.   EKG personally reviewed by myself on todays visit shows normal sinus rhythm with rate 83 bpm, no significant ST or T-wave changes Dated 06/30/2017   PMH:   has a past medical history of Acquired cyst of kidney, Asymptomatic varicose veins, Backache, unspecified, Diverticulosis, Dizziness and  giddiness, Environmental allergies, Esophageal reflux, Foot fracture, Hematuria, unspecified, Hirsutism, History of colon polyps, History of ovarian cyst, Hypercholesterolemia, IBS (irritable bowel syndrome), Internal hemorrhoids without mention of complication, Irritable bowel syndrome, Migraine headache, Nephrolithiasis, Nonspecific abnormal results of thyroid function study, Obesity, unspecified, Osteoarthrosis, unspecified whether generalized or localized, unspecified site, Other abnormal blood chemistry, Other abnormal glucose, Rosacea, Thyroid disease, Unspecified constipation, and Unspecified vitamin D deficiency.  PSH:    Past Surgical History:  Procedure Laterality Date  . ABDOMINAL EXPLORATION SURGERY  1979   pelvic pain  . ABDOMINAL HYSTERECTOMY    . APPENDECTOMY  1980  . HEMATOMA EVACUATION     left arm  . PARTIAL HYSTERECTOMY  1985   prolapse, ovaries not removed  . TUBAL LIGATION  1980    Current Outpatient Medications  Medication Sig Dispense Refill  . aspirin 325 MG EC tablet Take 325 mg by mouth daily.    Marland Kitchen atorvastatin (LIPITOR) 10 MG tablet TAKE 1 TABLET BY MOUTH DAILY 30 tablet 2  . Casanthranol-Docusate Sodium 30-100 MG CAPS Take 100 capsules by mouth daily as needed.     . celecoxib (CELEBREX) 200 MG capsule TAKE 1 CAPSULE BY MOUTH EVERY DAY 90 capsule 0  . cetirizine (ZYRTEC) 10 MG tablet Take 10 mg by mouth daily.    . citric acid-potassium citrate (POLYCITRA) 1100-334 MG/5ML solution Take 1 tsp three times a day.    . clidinium-chlordiazePOXIDE (LIBRAX) 5-2.5 MG capsule TAKE ONE CAPSULE BY MOUTH 3 TIMES A DAY AS NEEDED 270 capsule 1  . estradiol (ESTRACE VAGINAL) 0.1 MG/GM vaginal cream  One applicator q hs x 5 nights and then one applicator 2x/week. 42.5 g 1  . fluticasone (FLONASE) 50 MCG/ACT nasal spray Place 2 sprays into both nostrils daily. 16 g 3  . hydrochlorothiazide (HYDRODIURIL) 50 MG tablet Take 25 mg by mouth daily.   2  . hydrocortisone (ANUSOL-HC) 25  MG suppository Place 1 suppository (25 mg total) rectally 2 (two) times daily. 12 suppository 0  . hydrOXYzine (VISTARIL) 50 MG capsule TAKE 1 CAPSULE BY MOUTH TWICE DAILY AS NEEDED 180 capsule 2  . ibuprofen (ADVIL,MOTRIN) 200 MG tablet Take 400 mg by mouth 4 (four) times daily.     . tamsulosin (FLOMAX) 0.4 MG CAPS capsule Take 1 capsule by mouth at bedtime.    . traMADol (ULTRAM) 50 MG tablet TAKE ONE TABLET BY MOUTH EVERY DAY AS NEEDED 30 tablet 3  . VITAMIN D, CHOLECALCIFEROL, PO Take 5,000 Units by mouth daily.     No current facility-administered medications for this visit.      Allergies:   Influenza vaccines; Oseltamivir; and Other   Social History:  The patient  reports that  has never smoked. she has never used smokeless tobacco. She reports that she drinks alcohol. She reports that she does not use drugs.   Family History:   family history includes Alzheimer's disease in her mother; Asthma in her mother; COPD in her mother; Colon cancer in her paternal grandmother; Depression in her brother, father, and sister; Diabetes in her father, mother, and sister; Judith Rios' disease in her brother; Heart disease in her father; Hypercholesterolemia in her mother; Hyperlipidemia in her mother and sister; Hypertension in her mother; Lung cancer in her maternal grandmother; Macular degeneration in her father; Neuropathy in her father and sister; Parkinson's disease in her father; Stroke in her mother.    Review of Systems: Review of Systems  Constitutional: Negative.   Respiratory: Negative.   Cardiovascular:       Chest pressure at rest  Gastrointestinal: Negative.   Musculoskeletal: Negative.   Neurological: Negative.   Psychiatric/Behavioral: Negative.   All other systems reviewed and are negative.    PHYSICAL EXAM: VS:  BP 110/80 (BP Location: Left Arm, Patient Position: Sitting, Cuff Size: Normal)   Pulse 86   Ht 5' (1.524 m)   Wt 148 lb 8 oz (67.4 kg)   LMP 05/25/1983   BMI  29.00 kg/m  , BMI Body mass index is 29 kg/m. Constitutional:  oriented to person, place, and time. No distress.  HENT:  Head: Normocephalic and atraumatic.  Eyes:  no discharge. No scleral icterus.  Neck: Normal range of motion. Neck supple. No JVD present.  Cardiovascular: Normal rate, regular rhythm, normal heart sounds and intact distal pulses. Exam reveals no gallop and no friction rub. No edema No murmur heard. Pulmonary/Chest: Effort normal and breath sounds normal. No stridor. No respiratory distress.  no wheezes.  no rales.  no tenderness.  Abdominal: Soft.  no distension.  no tenderness.  Musculoskeletal: Normal range of motion.  no  tenderness or deformity.  Neurological:  normal muscle tone. Coordination normal. No atrophy Skin: Skin is warm and dry. No rash noted. not diaphoretic.  Psychiatric:  normal mood and affect. behavior is normal. Thought content normal.      Recent Labs: 03/22/2017: Hemoglobin 14.0; Platelets 221.0; TSH 2.93 06/28/2017: ALT 20 07/06/2017: BUN 19; Creatinine, Ser 1.01; Potassium 3.3; Sodium 139    Lipid Panel Lab Results  Component Value Date   CHOL 187 06/28/2017  HDL 56.90 06/28/2017   LDLCALC 91 06/28/2017   TRIG 200.0 (H) 06/28/2017      Wt Readings from Last 3 Encounters:  07/07/17 148 lb 8 oz (67.4 kg)  07/06/17 148 lb 9.6 oz (67.4 kg)  06/30/17 148 lb 4 oz (67.2 kg)       ASSESSMENT AND PLAN:  Hypercholesterolemia - Plan: CT CARDIAC SCORING CT coronary calcium score for risk stratification If Score is elevated would push for total cholesterol less than 150  Chest pain, unspecified type - Plan: CT CARDIAC SCORING Etiology of her chest pressure is unclear Presenting at rest, atypical in nature, started when she began taking potassium citrate powder 3 times a day Concerning for GI etiology Also takes large dose aspirin twice a day , four ibuprofen in the evening as well as Celebrex for severe arthritis She does not want to  try a holiday off the potassium citrate or HCTZ, concern for kidney stones For cardiac risk stratification recommended CT coronary calcium scoring Could try GERD regiment including Pepcid or omeprazole We'll discuss with Dr. Nicki Rios Stress testing likely will be low yield given atypical nature ---Potentially could add spironolactone to her HCTZ and decrease dosing of potassium citrate This was discussed with her  Kidney stones Severe disease on HCTZ and potassium citrate 3 times a day in powder form Consider adding Aldactone with HCTZ  Hypokalemia Secondary to HCTZ Consider Aldactone  Disposition:   F/U  As needed   Total encounter time more than 25 minutes  Greater than 50% was spent in counseling and coordination of care with the patient    Orders Placed This Encounter  Procedures  . CT CARDIAC SCORING     Signed, Esmond Plants, M.D., Ph.D. 07/07/2017  [place], [place]

## 2017-07-08 NOTE — Addendum Note (Signed)
Addended by: Minna Merritts on: 07/08/2017 01:53 PM   Modules accepted: Level of Service

## 2017-07-09 ENCOUNTER — Encounter: Payer: Self-pay | Admitting: Internal Medicine

## 2017-07-09 NOTE — Assessment & Plan Note (Signed)
Being followed by Dr [name].  On hctz 50mg  1/2 tablet q day.  On potassium citrate - ordered by Dr [name].  Has f/u planned.

## 2017-07-09 NOTE — Assessment & Plan Note (Signed)
On lipitor.  Low cholesterol diet and exercises.  Follow lipid panel and liver function tests.

## 2017-07-09 NOTE — Assessment & Plan Note (Signed)
Physical today 07/06/17.  S/p hysterectomy.  S/p colonosocpy.  Mammogram 05/19/16 - Bir[facility] I.  Schedule f/u mammogram.

## 2017-07-09 NOTE — Assessment & Plan Note (Signed)
Persistent intermittent episodes.  Previous troponin and D dimer negative.  Has appt with cardiology tomorrow.  Check cxr.  No acid reflux reported.

## 2017-07-09 NOTE — Addendum Note (Signed)
Addended by: Alisa Graff on: 07/09/2017 09:24 AM   Modules accepted: Orders

## 2017-07-09 NOTE — Assessment & Plan Note (Signed)
No acid reflux reported.  Follow.    

## 2017-07-09 NOTE — Assessment & Plan Note (Signed)
Low carb diet and exercise.  Follow met b and a1c.   

## 2017-07-09 NOTE — Assessment & Plan Note (Addendum)
Previous headache as outlined.  No headache now.  Has a history of migraines.  This felt somewhat similar, but in different location. Also having intermittent episodes.   Discussed further w/up and evaluation. Discussed MRI.  She agrees.   Check esr.  Follow.

## 2017-07-09 NOTE — Assessment & Plan Note (Signed)
Controlled.  

## 2017-07-09 NOTE — Assessment & Plan Note (Signed)
Increased stress.  Discussed with her today.  She desires no further intervention.  Follow.   

## 2017-07-09 NOTE — Assessment & Plan Note (Signed)
Being followed by urology.  

## 2017-07-10 ENCOUNTER — Ambulatory Visit (INDEPENDENT_AMBULATORY_CARE_PROVIDER_SITE_OTHER)
Admission: RE | Admit: 2017-07-10 | Discharge: 2017-07-10 | Disposition: A | Discharge status: Home / Self Care | Payer: Self-pay | Source: Ambulatory Visit | Attending: Cardiovascular Disease | Admitting: Cardiovascular Disease

## 2017-07-10 DIAGNOSIS — R0602 Shortness of breath: Secondary | ICD-10-CM

## 2017-07-10 DIAGNOSIS — E7800 Pure hypercholesterolemia, unspecified: Secondary | ICD-10-CM

## 2017-07-10 DIAGNOSIS — R079 Chest pain, unspecified: Secondary | ICD-10-CM

## 2017-07-11 ENCOUNTER — Telehealth: Payer: Self-pay

## 2017-07-11 ENCOUNTER — Other Ambulatory Visit: Payer: Self-pay | Admitting: Internal Medicine

## 2017-07-11 DIAGNOSIS — R51 Headache: Principal | ICD-10-CM

## 2017-07-11 DIAGNOSIS — R519 Headache, unspecified: Secondary | ICD-10-CM

## 2017-07-11 NOTE — Telephone Encounter (Signed)
-----   Message from Einar Pheasant, MD sent at 07/09/2017  9:25 AM EDT ----- Regarding: schedule mammogram Needs a mammogram scheduled - [facility].  Order in.    Thanks  Dr Nicki Reaper

## 2017-07-11 NOTE — Telephone Encounter (Signed)
Noted  

## 2017-07-11 NOTE — Progress Notes (Signed)
Order placed for MRI at [place] per pt request.

## 2017-07-11 NOTE — Telephone Encounter (Signed)
Called patient to make her aware of mammogram appt. Patient stated that she had discussed with you that she did not want a mammogram this year due to all the radiation she has been exposed to already. Patient also stated that she has changed the appt time of her MRI. She is now going to [place] due to cost. She has scheduled MRI for  April 1st and cancelled the appt for it to be done at [facility] tomorrow. I have let Melissa know and she is working on doing another authorization for [employer]. I will call and cancel mammogram as patient requested.

## 2017-07-11 NOTE — Telephone Encounter (Signed)
-----   Message from Lars Masson, LPN sent at 8/84/1660 10:20 AM EDT ----- Regarding: RE: schedule mammogram Mammogram scheduled. Will call patient to let her know.   ----- Message ----- From: Einar Pheasant, MD Sent: 07/09/2017   9:25 AM To: Lars Masson, LPN Subject: schedule mammogram                             Needs a mammogram scheduled - [facility].  Order in.    Thanks  Dr Nicki Reaper

## 2017-07-12 ENCOUNTER — Ambulatory Visit: Payer: BLUE CROSS/BLUE SHIELD

## 2017-07-19 ENCOUNTER — Other Ambulatory Visit: Payer: BLUE CROSS/BLUE SHIELD

## 2017-07-24 ENCOUNTER — Inpatient Hospital Stay: Admission: RE | Admit: 2017-07-24 | Payer: BLUE CROSS/BLUE SHIELD | Source: Ambulatory Visit

## 2017-07-25 ENCOUNTER — Other Ambulatory Visit: Payer: BLUE CROSS/BLUE SHIELD

## 2017-07-26 ENCOUNTER — Other Ambulatory Visit (INDEPENDENT_AMBULATORY_CARE_PROVIDER_SITE_OTHER): Payer: BLUE CROSS/BLUE SHIELD

## 2017-07-26 DIAGNOSIS — E876 Hypokalemia: Secondary | ICD-10-CM | POA: Diagnosis not present

## 2017-07-26 LAB — BASIC METABOLIC PANEL
BUN: 18 mg/dL (ref 6–23)
CO2: 31 meq/L (ref 19–32)
Calcium: 9.3 mg/dL (ref 8.4–10.5)
Chloride: 98 mEq/L (ref 96–112)
Creatinine, Ser: 1.01 mg/dL (ref 0.40–1.20)
GFR: 59.31 mL/min — ABNORMAL LOW (ref >60.00)
GLUCOSE: 117 mg/dL — AB (ref 70–99)
POTASSIUM: 3.3 meq/L — AB (ref 3.5–5.1)
SODIUM: 140 meq/L (ref 135–145)

## 2017-07-27 ENCOUNTER — Encounter: Payer: Self-pay | Admitting: Internal Medicine

## 2017-08-31 DIAGNOSIS — Z7982 Long term (current) use of aspirin: Secondary | ICD-10-CM | POA: Diagnosis not present

## 2017-08-31 DIAGNOSIS — Z79899 Other long term (current) drug therapy: Secondary | ICD-10-CM | POA: Diagnosis not present

## 2017-08-31 DIAGNOSIS — Z841 Family history of disorders of kidney and ureter: Secondary | ICD-10-CM | POA: Diagnosis not present

## 2017-08-31 DIAGNOSIS — N200 Calculus of kidney: Secondary | ICD-10-CM | POA: Diagnosis not present

## 2017-08-31 DIAGNOSIS — R937 Abnormal findings on diagnostic imaging of other parts of musculoskeletal system: Secondary | ICD-10-CM | POA: Diagnosis not present

## 2017-08-31 DIAGNOSIS — Z7951 Long term (current) use of inhaled steroids: Secondary | ICD-10-CM | POA: Diagnosis not present

## 2017-08-31 DIAGNOSIS — E7800 Pure hypercholesterolemia, unspecified: Secondary | ICD-10-CM | POA: Diagnosis not present

## 2017-08-31 DIAGNOSIS — N281 Cyst of kidney, acquired: Secondary | ICD-10-CM | POA: Diagnosis not present

## 2017-08-31 DIAGNOSIS — R42 Dizziness and giddiness: Secondary | ICD-10-CM | POA: Diagnosis not present

## 2017-08-31 DIAGNOSIS — Z6826 Body mass index (BMI) 26.0-26.9, adult: Secondary | ICD-10-CM | POA: Diagnosis not present

## 2017-08-31 DIAGNOSIS — R351 Nocturia: Secondary | ICD-10-CM | POA: Diagnosis not present

## 2017-09-05 DIAGNOSIS — N281 Cyst of kidney, acquired: Secondary | ICD-10-CM | POA: Diagnosis not present

## 2017-09-06 ENCOUNTER — Ambulatory Visit: Payer: BLUE CROSS/BLUE SHIELD | Admitting: Internal Medicine

## 2017-09-06 DIAGNOSIS — Z0289 Encounter for other administrative examinations: Secondary | ICD-10-CM

## 2017-10-09 ENCOUNTER — Other Ambulatory Visit: Payer: Self-pay | Admitting: Internal Medicine

## 2017-10-20 ENCOUNTER — Other Ambulatory Visit: Payer: Self-pay | Admitting: Internal Medicine

## 2017-10-31 ENCOUNTER — Encounter: Payer: Self-pay | Admitting: Internal Medicine

## 2017-10-31 DIAGNOSIS — E876 Hypokalemia: Secondary | ICD-10-CM

## 2017-10-31 DIAGNOSIS — R739 Hyperglycemia, unspecified: Secondary | ICD-10-CM

## 2017-10-31 DIAGNOSIS — E7800 Pure hypercholesterolemia, unspecified: Secondary | ICD-10-CM

## 2017-10-31 DIAGNOSIS — N200 Calculus of kidney: Secondary | ICD-10-CM

## 2017-11-01 NOTE — Telephone Encounter (Signed)
Orders placed for labs

## 2017-11-02 ENCOUNTER — Other Ambulatory Visit (INDEPENDENT_AMBULATORY_CARE_PROVIDER_SITE_OTHER): Payer: BLUE CROSS/BLUE SHIELD

## 2017-11-02 DIAGNOSIS — E7800 Pure hypercholesterolemia, unspecified: Secondary | ICD-10-CM

## 2017-11-02 DIAGNOSIS — E876 Hypokalemia: Secondary | ICD-10-CM | POA: Diagnosis not present

## 2017-11-02 DIAGNOSIS — N200 Calculus of kidney: Secondary | ICD-10-CM | POA: Diagnosis not present

## 2017-11-02 DIAGNOSIS — R739 Hyperglycemia, unspecified: Secondary | ICD-10-CM

## 2017-11-02 LAB — LDL CHOLESTEROL, DIRECT: LDL DIRECT: 112 mg/dL

## 2017-11-02 LAB — CBC WITH DIFFERENTIAL/PLATELET
BASOS ABS: 0.1 10*3/uL (ref 0.0–0.1)
Basophils Relative: 0.8 % (ref 0.0–3.0)
EOS PCT: 3.5 % (ref 0.0–5.0)
Eosinophils Absolute: 0.2 10*3/uL (ref 0.0–0.7)
HCT: 41.5 % (ref 36.0–46.0)
HEMOGLOBIN: 14.8 g/dL (ref 12.0–15.0)
Lymphocytes Relative: 38.6 % (ref 12.0–46.0)
Lymphs Abs: 2.4 10*3/uL (ref 0.7–4.0)
MCHC: 35.6 g/dL (ref 30.0–36.0)
MCV: 85.7 fl (ref 78.0–100.0)
MONO ABS: 0.5 10*3/uL (ref 0.1–1.0)
MONOS PCT: 8.8 % (ref 3.0–12.0)
Neutro Abs: 3 10*3/uL (ref 1.4–7.7)
Neutrophils Relative %: 48.3 % (ref 43.0–77.0)
Platelets: 284 10*3/uL (ref 150.0–400.0)
RBC: 4.84 Mil/uL (ref 3.87–5.11)
RDW: 12.8 % (ref 11.5–15.5)
WBC: 6.2 10*3/uL (ref 4.0–10.5)

## 2017-11-02 LAB — BASIC METABOLIC PANEL
BUN: 17 mg/dL (ref 6–23)
CALCIUM: 9.5 mg/dL (ref 8.4–10.5)
CHLORIDE: 95 meq/L — AB (ref 96–112)
CO2: 34 mEq/L — ABNORMAL HIGH (ref 19–32)
CREATININE: 1.08 mg/dL (ref 0.40–1.20)
GFR: 54.84 mL/min — ABNORMAL LOW (ref >60.00)
Glucose, Bld: 125 mg/dL — ABNORMAL HIGH (ref 70–99)
Potassium: 3 mEq/L — ABNORMAL LOW (ref 3.5–5.1)
Sodium: 140 mEq/L (ref 135–145)

## 2017-11-02 LAB — HEPATIC FUNCTION PANEL
ALK PHOS: 52 U/L (ref 39–117)
ALT: 32 U/L (ref 0–35)
AST: 23 U/L (ref 0–37)
Albumin: 4.6 g/dL (ref 3.5–5.2)
BILIRUBIN TOTAL: 0.8 mg/dL (ref 0.2–1.2)
Bilirubin, Direct: 0.1 mg/dL (ref 0.0–0.3)
Total Protein: 7.5 g/dL (ref 6.0–8.3)

## 2017-11-02 LAB — LIPID PANEL
Cholesterol: 190 mg/dL (ref 0–200)
HDL: 51.8 mg/dL (ref >39.00)
NONHDL: 138.38
Total CHOL/HDL Ratio: 4
Triglycerides: 214 mg/dL — ABNORMAL HIGH (ref 0.0–149.0)
VLDL: 42.8 mg/dL — AB (ref 0.0–40.0)

## 2017-11-02 LAB — VITAMIN D 25 HYDROXY (VIT D DEFICIENCY, FRACTURES): VITD: 33.01 ng/mL (ref 30.00–100.00)

## 2017-11-02 LAB — TSH: TSH: 4.7 u[IU]/mL — ABNORMAL HIGH (ref 0.35–4.50)

## 2017-11-02 LAB — HEMOGLOBIN A1C: HEMOGLOBIN A1C: 5.7 % (ref 4.6–6.5)

## 2017-11-06 ENCOUNTER — Telehealth: Payer: Self-pay | Admitting: *Deleted

## 2017-11-06 MED ORDER — ATORVASTATIN CALCIUM 20 MG PO TABS: 10.0000 mg | ORAL_TABLET | Freq: Every day | ORAL | 3 refills | Status: DC

## 2017-11-06 NOTE — Telephone Encounter (Signed)
-----   Message from Einar Pheasant, MD sent at 11/03/2017  5:35 AM EDT ----- Please call and notify pt that her bad cholesterol and triglycerides increased some when compared to the previous check.  Is she taking lipitor 10mg  daily.  If so, then increase to 20mg  q day.  Kidney function is relatively stable.  Potassium is low.  (potassium - 3.0).  How much potassium is she taking now?  Need to know so can adjust dose.  tsh is slightly elevated.  She is not on any thyroid medication.  Given just slight elevation, just need to recheck tsh in 6-8 weeks to confirm stable/normal.  Can schedule this at her upcoming appt.  Overall sugar control stable and ok.  Hgb, vitamin D level and liver function tests are wnl.

## 2017-11-08 ENCOUNTER — Telehealth: Payer: Self-pay | Admitting: *Deleted

## 2017-11-08 NOTE — Telephone Encounter (Signed)
-----   Message from Nona Dell, [place] sent at 11/06/2017 11:34 AM EDT ----- Result note read to patient; verbalizes understanding. Pt states she has been taking her lipitor 10mg , no missed doses. She continues to take usual dose of potassium (Polycitra- 1 teaspoon TID) but had missed "several " days prior to lab draw. Pt states she will be sure to take as ordered. States she has had some symptoms of low K+ as she has in past and will call if not resolved after "Getting back on track with med." When questioned re: symptoms pt reports "A little more fatigued, some palpitations". Reiterated need to CB if symptoms unresolved or worsen. Pt verbalizes understanding.   Pt is requesting RX for the 20mg  of Lipitor be sent to [facility]

## 2017-11-23 ENCOUNTER — Ambulatory Visit: Payer: BLUE CROSS/BLUE SHIELD | Admitting: Internal Medicine

## 2017-11-23 DIAGNOSIS — M545 Low back pain, unspecified: Secondary | ICD-10-CM

## 2017-11-23 DIAGNOSIS — K219 Gastro-esophageal reflux disease without esophagitis: Secondary | ICD-10-CM | POA: Diagnosis not present

## 2017-11-23 DIAGNOSIS — Z9109 Other allergy status, other than to drugs and biological substances: Secondary | ICD-10-CM

## 2017-11-23 DIAGNOSIS — N83209 Unspecified ovarian cyst, unspecified side: Secondary | ICD-10-CM

## 2017-11-23 DIAGNOSIS — N2889 Other specified disorders of kidney and ureter: Secondary | ICD-10-CM

## 2017-11-23 DIAGNOSIS — E7800 Pure hypercholesterolemia, unspecified: Secondary | ICD-10-CM

## 2017-11-23 DIAGNOSIS — N200 Calculus of kidney: Secondary | ICD-10-CM

## 2017-11-23 DIAGNOSIS — K6289 Other specified diseases of anus and rectum: Secondary | ICD-10-CM

## 2017-11-23 DIAGNOSIS — R739 Hyperglycemia, unspecified: Secondary | ICD-10-CM

## 2017-11-23 DIAGNOSIS — F439 Reaction to severe stress, unspecified: Secondary | ICD-10-CM

## 2017-11-23 DIAGNOSIS — R079 Chest pain, unspecified: Secondary | ICD-10-CM

## 2017-11-23 MED ORDER — CILIDINIUM-CHLORDIAZEPOXIDE 2.5-5 MG PO CAPS: ORAL_CAPSULE | ORAL | 0 refills | Status: DC

## 2017-11-23 NOTE — Patient Instructions (Signed)
Zantac 150mg  - take one tablet 30 minutes before breakfast
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
## 2017-11-26 ENCOUNTER — Encounter: Payer: Self-pay | Admitting: Internal Medicine

## 2017-11-26 DIAGNOSIS — K6289 Other specified diseases of anus and rectum: Secondary | ICD-10-CM | POA: Insufficient documentation

## 2017-11-26 NOTE — Assessment & Plan Note (Signed)
Controlled on current regimen.   

## 2017-11-26 NOTE — Assessment & Plan Note (Signed)
Has been stable.  Being followed by urology.

## 2017-11-26 NOTE — Assessment & Plan Note (Signed)
Low carb diet and exercise.  Follow met b and a1c.   

## 2017-11-26 NOTE — Assessment & Plan Note (Signed)
On lipitor.  Low cholesterol diet and exercise.  Follow lipid panel and liver function tests.   

## 2017-11-26 NOTE — Assessment & Plan Note (Signed)
No actual acid reflux reported.  Given tightness, will start zantac as outlined.  Follow.

## 2017-11-26 NOTE — Assessment & Plan Note (Signed)
Found on CT 09/2016.  States was evaluated by gyn.  States recommended no further w/up.

## 2017-11-26 NOTE — Assessment & Plan Note (Signed)
Increased stress as outlined.  Discussed with her today.  Desires no further intervention.  Follow.  

## 2017-11-26 NOTE — Assessment & Plan Note (Signed)
She reports concern regarding hemorrhoid.  Concern regarding possible rectal protrusion/prolapse.  Persistent problem.  Will have Dr Bary Castilla evaluate.

## 2017-11-26 NOTE — Assessment & Plan Note (Signed)
Persistent chest tightness.  Saw cardiology.  Calcium score - 0.  Question if related to GI etiology. Start zantac. Take regularly.  Decrease/stop antiinflammatories.  Declines changing potassium citrate and declines changing or adjusting medication - ex aldactone.

## 2017-11-26 NOTE — Assessment & Plan Note (Signed)
Persistent discomfort in her back.  Discussed with her today.  Discussed further w/up and evaluation.  Discussed physical therapy.  She declines.

## 2017-11-26 NOTE — Assessment & Plan Note (Signed)
Was followed by Dr [name].  Stable.  Has f/u planned with Dr Bernardo Heater.

## 2017-12-07 ENCOUNTER — Other Ambulatory Visit: Payer: BLUE CROSS/BLUE SHIELD

## 2017-12-13 ENCOUNTER — Other Ambulatory Visit: Payer: BLUE CROSS/BLUE SHIELD

## 2017-12-20 ENCOUNTER — Other Ambulatory Visit: Payer: BLUE CROSS/BLUE SHIELD

## 2017-12-20 ENCOUNTER — Other Ambulatory Visit (INDEPENDENT_AMBULATORY_CARE_PROVIDER_SITE_OTHER): Payer: BLUE CROSS/BLUE SHIELD

## 2017-12-20 DIAGNOSIS — R739 Hyperglycemia, unspecified: Secondary | ICD-10-CM | POA: Diagnosis not present

## 2017-12-20 LAB — BASIC METABOLIC PANEL
BUN: 15 mg/dL (ref 6–23)
CHLORIDE: 98 meq/L (ref 96–112)
CO2: 29 meq/L (ref 19–32)
Calcium: 9.7 mg/dL (ref 8.4–10.5)
Creatinine, Ser: 1.07 mg/dL (ref 0.40–1.20)
GFR: 55.41 mL/min — ABNORMAL LOW (ref >60.00)
GLUCOSE: 108 mg/dL — AB (ref 70–99)
POTASSIUM: 3.1 meq/L — AB (ref 3.5–5.1)
SODIUM: 140 meq/L (ref 135–145)

## 2018-01-05 ENCOUNTER — Other Ambulatory Visit: Payer: Self-pay | Admitting: Internal Medicine

## 2018-01-05 DIAGNOSIS — E876 Hypokalemia: Secondary | ICD-10-CM

## 2018-01-05 DIAGNOSIS — N200 Calculus of kidney: Secondary | ICD-10-CM

## 2018-01-05 NOTE — Progress Notes (Signed)
Order placed for f/u potassium check.  

## 2018-02-21 DIAGNOSIS — E876 Hypokalemia: Secondary | ICD-10-CM | POA: Diagnosis not present

## 2018-02-21 DIAGNOSIS — I10 Essential (primary) hypertension: Secondary | ICD-10-CM | POA: Diagnosis not present

## 2018-02-21 DIAGNOSIS — N2589 Other disorders resulting from impaired renal tubular function: Secondary | ICD-10-CM | POA: Diagnosis not present

## 2018-02-21 DIAGNOSIS — N183 Chronic kidney disease, stage 3 (moderate): Secondary | ICD-10-CM | POA: Diagnosis not present

## 2018-02-21 LAB — CBC AND DIFFERENTIAL
HCT: 42 (ref 36–46)
Hemoglobin: 15 (ref 12.0–16.0)
PLATELETS: 297 (ref 150–399)
WBC: 5.5

## 2018-02-21 LAB — HEPATIC FUNCTION PANEL
ALT: 25 (ref 7–35)
AST: 17 (ref 13–35)
Alkaline Phosphatase: 64 (ref 25–125)
Bilirubin, Total: 0.5

## 2018-02-21 LAB — BASIC METABOLIC PANEL
Potassium: 3.2 — AB (ref 3.4–5.3)
Sodium: 141 (ref 137–147)

## 2018-02-23 ENCOUNTER — Ambulatory Visit: Payer: BLUE CROSS/BLUE SHIELD | Admitting: Internal Medicine

## 2018-02-23 ENCOUNTER — Encounter: Payer: Self-pay | Admitting: Internal Medicine

## 2018-02-23 DIAGNOSIS — E7800 Pure hypercholesterolemia, unspecified: Secondary | ICD-10-CM | POA: Diagnosis not present

## 2018-02-23 DIAGNOSIS — Z9109 Other allergy status, other than to drugs and biological substances: Secondary | ICD-10-CM

## 2018-02-23 DIAGNOSIS — R739 Hyperglycemia, unspecified: Secondary | ICD-10-CM

## 2018-02-23 DIAGNOSIS — N200 Calculus of kidney: Secondary | ICD-10-CM

## 2018-02-23 DIAGNOSIS — F439 Reaction to severe stress, unspecified: Secondary | ICD-10-CM

## 2018-02-23 DIAGNOSIS — E876 Hypokalemia: Secondary | ICD-10-CM

## 2018-02-23 DIAGNOSIS — N2889 Other specified disorders of kidney and ureter: Secondary | ICD-10-CM

## 2018-02-23 DIAGNOSIS — R41840 Attention and concentration deficit: Secondary | ICD-10-CM

## 2018-02-23 DIAGNOSIS — G479 Sleep disorder, unspecified: Secondary | ICD-10-CM
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
## 2018-02-25 ENCOUNTER — Encounter: Payer: Self-pay | Admitting: Internal Medicine

## 2018-02-25 DIAGNOSIS — R41840 Attention and concentration deficit: Secondary | ICD-10-CM | POA: Insufficient documentation

## 2018-02-25 NOTE — Assessment & Plan Note (Signed)
On lipitor.  Low cholesterol diet and exercise.  Follow lipid panel and liver function tests.   

## 2018-02-25 NOTE — Assessment & Plan Note (Signed)
Followed by urology.   

## 2018-02-25 NOTE — Assessment & Plan Note (Signed)
Increased stress as outlined.  Discussed with her today.  She feels she is handling things relatively well.

## 2018-02-25 NOTE — Assessment & Plan Note (Signed)
Low carb diet and exercise.  Follow met b and a1c.   

## 2018-02-25 NOTE — Assessment & Plan Note (Signed)
Problems with persistent low potassium.  Seeing nephrology. Potassium supplements adjusted.

## 2018-02-25 NOTE — Assessment & Plan Note (Signed)
Controlled on current regimen.   

## 2018-02-25 NOTE — Assessment & Plan Note (Signed)
On hydroxyzine.  Uses this to help her sleep.  Trouble falling asleep now.  Feels is related to her mind not shutting down.  She is concerned regarding possible ADHD.  Wants evaluation.

## 2018-02-25 NOTE — Assessment & Plan Note (Signed)
Trouble focusing and trouble concentrating.  She is concerned she has ADHD.  Desires testing.  Refer to Dr Randel Pigg.

## 2018-02-25 NOTE — Assessment & Plan Note (Signed)
Has been followed by urology.  Now seeing nephrology.

## 2018-03-21 ENCOUNTER — Other Ambulatory Visit: Payer: Self-pay | Admitting: Internal Medicine

## 2018-03-21 NOTE — Telephone Encounter (Signed)
Copied from [place] [id number]. Topic: Quick Communication - Rx Refill/Question >> Mar 21, 2018  3:50 PM Keene Breath wrote: Medication: hydrocortisone (ANUSOL-HC) 25 MG suppository, Patient would like 30 MG  Patient called to get a refill for the above medication.  Patient would like it as soon as possible.  Preferred Pharmacy (with phone number or street name): [place], [place] - [place] [phone] (Phone) [phone] (Fax)

## 2018-03-21 NOTE — Telephone Encounter (Signed)
Requested medication (s) are due for refill today - no  Requested medication (s) are on the active medication list -no  Future visit scheduled -yes  Last refill: 2015  Notes to clinic: Patient is calling to request a refill of an old Rx- she is having a flare of hemorrhoids and needs Anusol-HC   Requested Prescriptions  Pending Prescriptions Disp Refills   hydrocortisone (ANUSOL-HC) 25 MG suppository 12 suppository 0    Sig: Place 1 suppository (25 mg total) rectally 2 (two) times daily.     Off-Protocol Failed - 03/21/2018  3:57 PM      Failed - Medication not assigned to a protocol, review manually.      Passed - Valid encounter within last 12 months    Recent Outpatient Visits          3 weeks ago Environmental allergies   [facility], [place], MD   3 months ago Low back pain without sciatica, unspecified back pain laterality, unspecified chronicity   [facility] [place], Randell Patient, MD   8 months ago Nephrolithiasis   [facility] [place], Randell Patient, MD   8 months ago Chest pressure   [facility] [place], Randell Patient, MD   11 months ago Hypercholesterolemia   [place], MD      Future Appointments            In 1 month Einar Pheasant, MD [place], [facility]            Requested Prescriptions  Pending Prescriptions Disp Refills   hydrocortisone (ANUSOL-HC) 25 MG suppository 12 suppository 0    Sig: Place 1 suppository (25 mg total) rectally 2 (two) times daily.     Off-Protocol Failed - 03/21/2018  3:57 PM      Failed - Medication not assigned to a protocol, review manually.      Passed - Valid encounter within last 12 months    Recent Outpatient Visits          3 weeks ago Environmental allergies   [place], [place], MD   3 months ago Low back pain without sciatica, unspecified back pain  laterality, unspecified chronicity   [facility]  [place], Randell Patient, MD   8 months ago Nephrolithiasis   [facility] [place], Randell Patient, MD   8 months ago Chest pressure   [facility] [place], Randell Patient, MD   11 months ago Hypercholesterolemia   [place], MD      Future Appointments            In 1 month Einar Pheasant, MD [place], [place]

## 2018-03-24 MED ORDER — HYDROCORTISONE ACETATE 25 MG RE SUPP: 25.0000 mg | Freq: Two times a day (BID) | RECTAL | 0 refills | Status: DC

## 2018-04-04 ENCOUNTER — Encounter: Payer: Self-pay | Admitting: Urology

## 2018-04-04 ENCOUNTER — Ambulatory Visit: Payer: BLUE CROSS/BLUE SHIELD | Admitting: Urology

## 2018-04-04 VITALS — BP 98/66 | HR 92 | Ht 60.0 in | Wt 140.1 lb

## 2018-04-04 DIAGNOSIS — R339 Retention of urine, unspecified: Secondary | ICD-10-CM | POA: Diagnosis not present

## 2018-04-04 DIAGNOSIS — Z87442 Personal history of urinary calculi: Secondary | ICD-10-CM | POA: Insufficient documentation

## 2018-04-04 DIAGNOSIS — N281 Cyst of kidney, acquired: Secondary | ICD-10-CM

## 2018-04-04 LAB — URINALYSIS, COMPLETE
BILIRUBIN UA: NEGATIVE
Glucose, UA: NEGATIVE
Ketones, UA: NEGATIVE
NITRITE UA: NEGATIVE
PH UA: 6.5 (ref 5.0–7.5)
PROTEIN UA: NEGATIVE
Specific Gravity, UA: 1.02 (ref 1.005–1.030)
Urobilinogen, Ur: 0.2 mg/dL (ref 0.2–1.0)

## 2018-04-04 LAB — BLADDER SCAN AMB NON-IMAGING

## 2018-04-04 LAB — MICROSCOPIC EXAMINATION: RBC, UA: NONE SEEN /hpf (ref 0–2)
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
## 2018-04-10 ENCOUNTER — Encounter: Payer: Self-pay | Admitting: General Surgery

## 2018-04-10 ENCOUNTER — Other Ambulatory Visit: Payer: Self-pay

## 2018-04-10 ENCOUNTER — Ambulatory Visit: Payer: BLUE CROSS/BLUE SHIELD | Admitting: General Surgery

## 2018-04-10 VITALS — BP 106/75 | HR 85 | Temp 97.9°F | Resp 16 | Ht 60.0 in | Wt 138.0 lb

## 2018-04-10 DIAGNOSIS — K625 Hemorrhage of anus and rectum: Secondary | ICD-10-CM | POA: Diagnosis not present

## 2018-04-10 DIAGNOSIS — K648 Other hemorrhoids: Secondary | ICD-10-CM | POA: Diagnosis not present

## 2018-04-10 DIAGNOSIS — K623 Rectal prolapse: Secondary | ICD-10-CM | POA: Diagnosis not present
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
## 2018-04-10 NOTE — Patient Instructions (Addendum)
Recommend complete her 5 year colonoscopy and discuss with her GI physician recommendations for hemorrhoid stapling. The patient is aware to call back for any questions or new concerns.

## 2018-04-11 DIAGNOSIS — K648 Other hemorrhoids: Secondary | ICD-10-CM | POA: Insufficient documentation

## 2018-04-11 DIAGNOSIS — K625 Hemorrhage of anus and rectum: Secondary | ICD-10-CM | POA: Insufficient documentation

## 2018-04-11 DIAGNOSIS — K623 Rectal prolapse: Secondary | ICD-10-CM | POA: Insufficient documentation

## 2018-04-23 DIAGNOSIS — K623 Rectal prolapse: Secondary | ICD-10-CM | POA: Diagnosis not present

## 2018-04-23 DIAGNOSIS — K625 Hemorrhage of anus and rectum: Secondary | ICD-10-CM | POA: Diagnosis not present

## 2018-04-23 DIAGNOSIS — K589 Irritable bowel syndrome without diarrhea: Secondary | ICD-10-CM | POA: Diagnosis not present

## 2018-04-23 DIAGNOSIS — K649 Unspecified hemorrhoids: Secondary | ICD-10-CM | POA: Diagnosis not present

## 2018-04-23 DIAGNOSIS — D369 Benign neoplasm, unspecified site: Secondary | ICD-10-CM | POA: Diagnosis not present

## 2018-05-07 ENCOUNTER — Telehealth: Payer: Self-pay

## 2018-05-07 ENCOUNTER — Other Ambulatory Visit (INDEPENDENT_AMBULATORY_CARE_PROVIDER_SITE_OTHER): Payer: BLUE CROSS/BLUE SHIELD

## 2018-05-07 ENCOUNTER — Other Ambulatory Visit: Payer: Self-pay | Admitting: Internal Medicine

## 2018-05-07 DIAGNOSIS — E876 Hypokalemia: Secondary | ICD-10-CM

## 2018-05-07 DIAGNOSIS — E7800 Pure hypercholesterolemia, unspecified: Secondary | ICD-10-CM | POA: Diagnosis not present

## 2018-05-07 DIAGNOSIS — R739 Hyperglycemia, unspecified: Secondary | ICD-10-CM

## 2018-05-07 NOTE — Telephone Encounter (Signed)
Are you okay with her coming in this afternoon for labs? Per her last labs, her potassium was 3.2

## 2018-05-07 NOTE — Telephone Encounter (Signed)
Judith Rios Patient is calling back to see if she can come in this afternoon.

## 2018-05-07 NOTE — Progress Notes (Signed)
Orders placed for labs

## 2018-05-07 NOTE — Telephone Encounter (Signed)
Copied from [place] [id number]. Topic: General - Other >> May 07, 2018  1:42 PM Yvette Rack wrote: Reason for CRM: Pt requests to have labs drawn  today if possible before coming in for the appt with Dr. Nicki Reaper on 05/08/18 at 11:30 am. No active orders but patient insist on having labs drawn as she stated that her potassium was 2.7. Pt requests call back. Cb# [phone]

## 2018-05-07 NOTE — Addendum Note (Signed)
Addended by: Lars Masson on: 05/07/2018 03:27 PM   Modules accepted: Orders

## 2018-05-07 NOTE — Telephone Encounter (Signed)
Scheduled patient for labs and I added a cholesterol because patient stated that she has been fasting all day and would like to have it checked. Advised patient that she will need to be here by 4 to have labs drawn

## 2018-05-07 NOTE — Telephone Encounter (Signed)
I have placed orders for labs.  I did not order cholesterol, because if she comes in this pm - I assume she has eaten today.  Will need to see if opening for labs on schedule.  Orders in.

## 2018-05-08 ENCOUNTER — Ambulatory Visit: Payer: BLUE CROSS/BLUE SHIELD | Admitting: Internal Medicine

## 2018-05-08 ENCOUNTER — Ambulatory Visit (INDEPENDENT_AMBULATORY_CARE_PROVIDER_SITE_OTHER): Payer: BLUE CROSS/BLUE SHIELD

## 2018-05-08 VITALS — BP 100/68 | HR 80 | Temp 97.9°F | Resp 16 | Wt 140.8 lb

## 2018-05-08 DIAGNOSIS — R739 Hyperglycemia, unspecified: Secondary | ICD-10-CM

## 2018-05-08 DIAGNOSIS — R109 Unspecified abdominal pain: Secondary | ICD-10-CM

## 2018-05-08 DIAGNOSIS — M25551 Pain in right hip: Secondary | ICD-10-CM

## 2018-05-08 DIAGNOSIS — M545 Low back pain, unspecified: Secondary | ICD-10-CM

## 2018-05-08 DIAGNOSIS — E7800 Pure hypercholesterolemia, unspecified: Secondary | ICD-10-CM

## 2018-05-08 DIAGNOSIS — R300 Dysuria: Secondary | ICD-10-CM | POA: Diagnosis not present

## 2018-05-08 DIAGNOSIS — N2889 Other specified disorders of kidney and ureter: Secondary | ICD-10-CM

## 2018-05-08 DIAGNOSIS — K648 Other hemorrhoids: Secondary | ICD-10-CM

## 2018-05-08 DIAGNOSIS — M1611 Unilateral primary osteoarthritis, right hip: Secondary | ICD-10-CM | POA: Diagnosis not present

## 2018-05-08 DIAGNOSIS — Z9109 Other allergy status, other than to drugs and biological substances: Secondary | ICD-10-CM

## 2018-05-08 DIAGNOSIS — F439 Reaction to severe stress, unspecified: Secondary | ICD-10-CM

## 2018-05-08 LAB — HEPATIC FUNCTION PANEL
ALT: 28 U/L (ref 0–35)
AST: 18 U/L (ref 0–37)
Albumin: 5 g/dL (ref 3.5–5.2)
Alkaline Phosphatase: 58 U/L (ref 39–117)
Bilirubin, Direct: 0.1 mg/dL (ref 0.0–0.3)
Total Bilirubin: 0.7 mg/dL (ref 0.2–1.2)
Total Protein: 7.8 g/dL (ref 6.0–8.3)

## 2018-05-08 LAB — POCT URINALYSIS DIPSTICK
Blood, UA: NEGATIVE
Glucose, UA: NEGATIVE
KETONES UA: NEGATIVE
Nitrite, UA: NEGATIVE
Protein, UA: POSITIVE — AB
Spec Grav, UA: 1.02 (ref 1.010–1.025)
Urobilinogen, UA: 1 E.U./dL
pH, UA: 6 (ref 5.0–8.0)

## 2018-05-08 LAB — LIPID PANEL
Cholesterol: 191 mg/dL (ref 0–200)
HDL: 54.3 mg/dL (ref >39.00)
LDL Cholesterol: 99 mg/dL (ref 0–99)
NONHDL: 136.44
TRIGLYCERIDES: 186 mg/dL — AB (ref 0.0–149.0)
Total CHOL/HDL Ratio: 4
VLDL: 37.2 mg/dL (ref 0.0–40.0)

## 2018-05-08 LAB — BASIC METABOLIC PANEL
BUN: 19 mg/dL (ref 6–23)
CALCIUM: 10.3 mg/dL (ref 8.4–10.5)
CO2: 31 mEq/L (ref 19–32)
Chloride: 97 mEq/L (ref 96–112)
Creatinine, Ser: 1.06 mg/dL (ref 0.40–1.20)
GFR: 55.94 mL/min — ABNORMAL LOW (ref >60.00)
Glucose, Bld: 98 mg/dL (ref 70–99)
Potassium: 3.5 mEq/L (ref 3.5–5.1)
Sodium: 139 mEq/L (ref 135–145)

## 2018-05-08 LAB — URINALYSIS, MICROSCOPIC ONLY

## 2018-05-08 LAB — HEMOGLOBIN A1C: Hgb A1c MFr Bld: 5.6 % (ref 4.6–6.5)

## 2018-05-08 LAB — TSH: TSH: 3.15 u[IU]/mL (ref 0.35–4.50)

## 2018-05-08 MED ORDER — CILIDINIUM-CHLORDIAZEPOXIDE 2.5-5 MG PO CAPS: ORAL_CAPSULE | ORAL | 0 refills | Status: DC

## 2018-05-08 MED ORDER — HYDROCORTISONE ACETATE 25 MG RE SUPP: 25.0000 mg | Freq: Two times a day (BID) | RECTAL | 0 refills | Status: DC
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
## 2018-05-09 ENCOUNTER — Other Ambulatory Visit: Payer: Self-pay | Admitting: Internal Medicine

## 2018-05-09 DIAGNOSIS — E876 Hypokalemia: Secondary | ICD-10-CM | POA: Diagnosis not present

## 2018-05-09 DIAGNOSIS — I10 Essential (primary) hypertension: Secondary | ICD-10-CM | POA: Diagnosis not present

## 2018-05-09 DIAGNOSIS — M25551 Pain in right hip: Secondary | ICD-10-CM

## 2018-05-09 DIAGNOSIS — N183 Chronic kidney disease, stage 3 (moderate): Secondary | ICD-10-CM | POA: Diagnosis not present

## 2018-05-09 NOTE — Progress Notes (Signed)
Order placed for ortho referral.   

## 2018-05-10 ENCOUNTER — Telehealth: Payer: Self-pay

## 2018-05-10 NOTE — Telephone Encounter (Signed)
Mychart message sent.

## 2018-05-10 NOTE — Telephone Encounter (Signed)
Copied from [place] [id number]. Topic: General - Other >> May 10, 2018  4:16 PM Yvette Rack wrote: Reason for CRM: Pt requests to speak with Larena Glassman for an update on the urine culture. Pt requests call back. Cb# [phone]

## 2018-05-11 ENCOUNTER — Other Ambulatory Visit: Payer: Self-pay

## 2018-05-11 LAB — URINE CULTURE
MICRO NUMBER:: 52662
SPECIMEN QUALITY:: ADEQUATE

## 2018-05-11 MED ORDER — CEFDINIR 300 MG PO CAPS: 300.0000 mg | ORAL_CAPSULE | Freq: Two times a day (BID) | ORAL | 0 refills | Status: DC

## 2018-05-11 NOTE — Telephone Encounter (Signed)
See result note.  

## 2018-05-11 NOTE — Telephone Encounter (Signed)
Pt states her culture results are in now and she is needing this sent to her urologist in order to get her a rx sent in. Requesting call back.

## 2018-05-13 ENCOUNTER — Encounter: Payer: Self-pay | Admitting: Internal Medicine

## 2018-05-13 DIAGNOSIS — M25551 Pain in right hip: Secondary | ICD-10-CM | POA: Insufficient documentation

## 2018-05-13 NOTE — Assessment & Plan Note (Signed)
Increased stress.  Overall she feels she is handling things relatively well.  Follow.   

## 2018-05-13 NOTE — Assessment & Plan Note (Signed)
Controlled on current regimen.   

## 2018-05-13 NOTE — Assessment & Plan Note (Signed)
Saw surgery.  Continue annusol HC suppositories.  Hold on surgery.

## 2018-05-13 NOTE — Assessment & Plan Note (Signed)
With back pain and pain RLQ.  She is concerned regarding possible stone.  Has been followed by urology for recurring stones.  Check urine to confirm no infection.

## 2018-05-13 NOTE — Assessment & Plan Note (Signed)
Low carb diet and exercise.  Follow met b and a1c.  

## 2018-05-13 NOTE — Assessment & Plan Note (Signed)
On lipitor.  Low cholesterol diet and exercise.  Follow lipid panel and liver function tests.   

## 2018-05-13 NOTE — Assessment & Plan Note (Signed)
Persistent right hip pain.  Given persistent pain, check xray right hip. May need ortho referral.

## 2018-05-13 NOTE — Assessment & Plan Note (Signed)
Followed by urology.   

## 2018-05-31 ENCOUNTER — Ambulatory Visit (INDEPENDENT_AMBULATORY_CARE_PROVIDER_SITE_OTHER): Payer: BLUE CROSS/BLUE SHIELD | Admitting: Psychology

## 2018-05-31 DIAGNOSIS — F4322 Adjustment disorder with anxiety: Secondary | ICD-10-CM

## 2018-05-31 DIAGNOSIS — F909 Attention-deficit hyperactivity disorder, unspecified type: Secondary | ICD-10-CM | POA: Diagnosis not present

## 2018-06-01 ENCOUNTER — Other Ambulatory Visit: Payer: Self-pay | Admitting: Internal Medicine

## 2018-06-07 ENCOUNTER — Other Ambulatory Visit: Payer: Self-pay

## 2018-06-07 DIAGNOSIS — K921 Melena: Secondary | ICD-10-CM | POA: Diagnosis not present

## 2018-06-07 DIAGNOSIS — D120 Benign neoplasm of cecum: Secondary | ICD-10-CM | POA: Diagnosis not present

## 2018-06-07 DIAGNOSIS — K621 Rectal polyp: Secondary | ICD-10-CM | POA: Diagnosis not present

## 2018-06-07 DIAGNOSIS — K5730 Diverticulosis of large intestine without perforation or abscess without bleeding: Secondary | ICD-10-CM | POA: Diagnosis not present

## 2018-06-07 MED ORDER — HYDROCHLOROTHIAZIDE 50 MG PO TABS: 25.0000 mg | ORAL_TABLET | Freq: Every day | ORAL | 3 refills | Status: DC

## 2018-06-07 NOTE — Telephone Encounter (Signed)
Incoming rx request for HCTZ, pt was initially put on this medication by Dr.Cope, please advise if pt needs to continue the medication

## 2018-06-22 ENCOUNTER — Ambulatory Visit: Payer: BLUE CROSS/BLUE SHIELD | Admitting: Psychology

## 2018-06-29 ENCOUNTER — Ambulatory Visit (INDEPENDENT_AMBULATORY_CARE_PROVIDER_SITE_OTHER): Payer: BLUE CROSS/BLUE SHIELD | Admitting: Psychology

## 2018-06-29 DIAGNOSIS — F411 Generalized anxiety disorder: Secondary | ICD-10-CM

## 2018-06-29 DIAGNOSIS — F900 Attention-deficit hyperactivity disorder, predominantly inattentive type: Secondary | ICD-10-CM

## 2018-07-05 DIAGNOSIS — G8929 Other chronic pain: Secondary | ICD-10-CM | POA: Diagnosis not present

## 2018-07-05 DIAGNOSIS — M5441 Lumbago with sciatica, right side: Secondary | ICD-10-CM | POA: Diagnosis not present

## 2018-07-05 DIAGNOSIS — M545 Low back pain: Secondary | ICD-10-CM | POA: Diagnosis not present

## 2018-07-05 DIAGNOSIS — M7061 Trochanteric bursitis, right hip: Secondary | ICD-10-CM | POA: Diagnosis not present

## 2018-07-05 DIAGNOSIS — M25551 Pain in right hip: Secondary | ICD-10-CM | POA: Diagnosis not present

## 2018-07-09 ENCOUNTER — Ambulatory Visit: Payer: BLUE CROSS/BLUE SHIELD | Admitting: Psychology

## 2018-07-13 ENCOUNTER — Other Ambulatory Visit: Payer: Self-pay | Admitting: Family Medicine

## 2018-07-13 ENCOUNTER — Other Ambulatory Visit: Payer: Self-pay | Admitting: Internal Medicine

## 2018-07-13 ENCOUNTER — Telehealth: Payer: Self-pay | Admitting: Urology

## 2018-07-13 MED ORDER — HYDROCHLOROTHIAZIDE 50 MG PO TABS: 50.0000 mg | ORAL_TABLET | Freq: Every day | ORAL | 3 refills | Status: DC

## 2018-07-13 NOTE — Telephone Encounter (Signed)
Needs refill for hydochlorathiazide also she states it needs to be a full tablet, not half and 50mg 

## 2018-07-15 NOTE — Telephone Encounter (Signed)
Cope started her on it for hypercalciuria.  Okay to refill but would recommend a repeat 24-hour urine study to make sure it is still effective.  Would only refill for 3 months.

## 2018-07-16 ENCOUNTER — Ambulatory Visit (INDEPENDENT_AMBULATORY_CARE_PROVIDER_SITE_OTHER): Payer: BLUE CROSS/BLUE SHIELD | Admitting: Psychology

## 2018-07-16 DIAGNOSIS — F900 Attention-deficit hyperactivity disorder, predominantly inattentive type: Secondary | ICD-10-CM

## 2018-07-16 DIAGNOSIS — F411 Generalized anxiety disorder: Secondary | ICD-10-CM

## 2018-07-17 ENCOUNTER — Ambulatory Visit: Payer: BLUE CROSS/BLUE SHIELD | Admitting: Psychology

## 2018-07-17 MED ORDER — HYDROCHLOROTHIAZIDE 50 MG PO TABS: 50.0000 mg | ORAL_TABLET | Freq: Every day | ORAL | 0 refills | Status: DC

## 2018-07-17 NOTE — Telephone Encounter (Signed)
I will not be able to give additional refills of the hydrochlorothiazide without a 24-hour urine study.  If she refuses to have done she will need to get her nephrologist to take over refilling this medication.

## 2018-07-17 NOTE — Telephone Encounter (Signed)
Patient states that this is what she has always taken and that she already had a 24 hour urine in the past and her Nephrologist also recommends taking due to her potassium as well. 3 month refill was sent. Patient refused 24 hour urine

## 2018-07-17 NOTE — Addendum Note (Signed)
Addended by: Tommy Rainwater on: 07/17/2018 03:16 PM   Modules accepted: Orders

## 2018-07-20 NOTE — Telephone Encounter (Signed)
.  mychart

## 2018-08-06 ENCOUNTER — Telehealth: Payer: Self-pay

## 2018-08-06 NOTE — Telephone Encounter (Signed)
Copied from [place] [id number]. Topic: Appointment Scheduling - Scheduling Inquiry for Clinic >> Aug 06, 2018  2:34 PM Judith Rios wrote: Reason for CRM: Patient calling to switch to e-visit 04/21 and wants to make sure she is ok to fo lab appt this Wednesday 04/15. # and email correct.

## 2018-08-08 ENCOUNTER — Other Ambulatory Visit: Payer: Self-pay

## 2018-08-08 NOTE — Telephone Encounter (Signed)
The patient has a virtual visit and labs scheduled.

## 2018-08-09 ENCOUNTER — Other Ambulatory Visit (INDEPENDENT_AMBULATORY_CARE_PROVIDER_SITE_OTHER): Payer: BLUE CROSS/BLUE SHIELD

## 2018-08-09 ENCOUNTER — Encounter: Payer: Self-pay | Admitting: Internal Medicine

## 2018-08-09 ENCOUNTER — Other Ambulatory Visit: Payer: Self-pay

## 2018-08-09 DIAGNOSIS — R739 Hyperglycemia, unspecified: Secondary | ICD-10-CM | POA: Diagnosis not present

## 2018-08-09 DIAGNOSIS — E7800 Pure hypercholesterolemia, unspecified: Secondary | ICD-10-CM | POA: Diagnosis not present

## 2018-08-09 LAB — LIPID PANEL
Cholesterol: 172 mg/dL (ref 0–200)
HDL: 55.2 mg/dL (ref >39.00)
LDL Cholesterol: 77 mg/dL (ref 0–99)
NonHDL: 116.85
Total CHOL/HDL Ratio: 3
Triglycerides: 199 mg/dL — ABNORMAL HIGH (ref 0.0–149.0)
VLDL: 39.8 mg/dL (ref 0.0–40.0)

## 2018-08-09 LAB — BASIC METABOLIC PANEL
BUN: 22 mg/dL (ref 6–23)
CO2: 32 mEq/L (ref 19–32)
Calcium: 9.4 mg/dL (ref 8.4–10.5)
Chloride: 97 mEq/L (ref 96–112)
Creatinine, Ser: 1.16 mg/dL (ref 0.40–1.20)
GFR: 47.39 mL/min — ABNORMAL LOW (ref >60.00)
Glucose, Bld: 106 mg/dL — ABNORMAL HIGH (ref 70–99)
Potassium: 3.4 mEq/L — ABNORMAL LOW (ref 3.5–5.1)
Sodium: 140 mEq/L (ref 135–145)

## 2018-08-09 LAB — HEPATIC FUNCTION PANEL
ALT: 28 U/L (ref 0–35)
AST: 21 U/L (ref 0–37)
Albumin: 4.5 g/dL (ref 3.5–5.2)
Alkaline Phosphatase: 56 U/L (ref 39–117)
Bilirubin, Direct: 0.1 mg/dL (ref 0.0–0.3)
Total Bilirubin: 0.5 mg/dL (ref 0.2–1.2)
Total Protein: 7.3 g/dL (ref 6.0–8.3)

## 2018-08-09 LAB — TSH: TSH: 6.53 u[IU]/mL — ABNORMAL HIGH (ref 0.35–4.50)

## 2018-08-09 LAB — HEMOGLOBIN A1C: Hgb A1c MFr Bld: 5.7 % (ref 4.6–6.5)

## 2018-08-10 NOTE — Telephone Encounter (Signed)
See attached

## 2018-08-10 NOTE — Telephone Encounter (Signed)
We have received these records. I have placed them out for you to review in case you would like to prior to her appointment next week.

## 2018-08-10 NOTE — Telephone Encounter (Signed)
I will review Dr Theodoro Grist records and we can discuss at her appt if she is ok with this.  Thanks

## 2018-08-13 ENCOUNTER — Ambulatory Visit (INDEPENDENT_AMBULATORY_CARE_PROVIDER_SITE_OTHER): Payer: BLUE CROSS/BLUE SHIELD | Admitting: Internal Medicine

## 2018-08-13 ENCOUNTER — Encounter: Payer: Self-pay | Admitting: Internal Medicine

## 2018-08-13 DIAGNOSIS — M25551 Pain in right hip: Secondary | ICD-10-CM

## 2018-08-13 DIAGNOSIS — K648 Other hemorrhoids: Secondary | ICD-10-CM | POA: Diagnosis not present

## 2018-08-13 DIAGNOSIS — E876 Hypokalemia: Secondary | ICD-10-CM

## 2018-08-13 DIAGNOSIS — Z9109 Other allergy status, other than to drugs and biological substances: Secondary | ICD-10-CM

## 2018-08-13 DIAGNOSIS — N200 Calculus of kidney: Secondary | ICD-10-CM

## 2018-08-13 DIAGNOSIS — E7800 Pure hypercholesterolemia, unspecified: Secondary | ICD-10-CM | POA: Diagnosis not present

## 2018-08-13 DIAGNOSIS — F439 Reaction to severe stress, unspecified: Secondary | ICD-10-CM

## 2018-08-13 DIAGNOSIS — R739 Hyperglycemia, unspecified: Secondary | ICD-10-CM

## 2018-08-13 DIAGNOSIS — Z87442 Personal history of urinary calculi: Secondary | ICD-10-CM

## 2018-08-13 DIAGNOSIS — F900 Attention-deficit hyperactivity disorder, predominantly inattentive type: Secondary | ICD-10-CM

## 2018-08-13 DIAGNOSIS — R7989 Other specified abnormal findings of blood chemistry: Secondary | ICD-10-CM

## 2018-08-13 DIAGNOSIS — Z1239 Encounter for other screening for malignant neoplasm of breast: Secondary | ICD-10-CM
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
## 2018-08-14 ENCOUNTER — Encounter: Payer: Self-pay | Admitting: Internal Medicine

## 2018-08-15 ENCOUNTER — Encounter: Payer: Self-pay | Admitting: Internal Medicine

## 2018-08-15 DIAGNOSIS — F909 Attention-deficit hyperactivity disorder, unspecified type: Secondary | ICD-10-CM | POA: Insufficient documentation

## 2018-08-15 DIAGNOSIS — R7989 Other specified abnormal findings of blood chemistry: Secondary | ICD-10-CM | POA: Insufficient documentation

## 2018-08-15 NOTE — Assessment & Plan Note (Signed)
Low carb diet and exercise.  Follow met b and a1c.   

## 2018-08-15 NOTE — Assessment & Plan Note (Signed)
Taking potassium twice a day now.  Recent potassium 3.4.  Follow.  Continue f/u with nephrology.

## 2018-08-15 NOTE — Assessment & Plan Note (Signed)
Followed by urology.   

## 2018-08-15 NOTE — Assessment & Plan Note (Signed)
S/p surgery 

## 2018-08-15 NOTE — Assessment & Plan Note (Signed)
Saw ortho.  Diagnosed with right trochanteric bursitis.  Recommended stretches.  Follow.

## 2018-08-15 NOTE — Assessment & Plan Note (Signed)
On lipitor.  Low cholesterol diet and exercise.  Follow lipid panel and liver function tests.   

## 2018-08-15 NOTE — Assessment & Plan Note (Signed)
Controlled on current regimen - zyrtec and ocean spray.

## 2018-08-15 NOTE — Assessment & Plan Note (Signed)
Increased stress.  Discussed with her today.  Overall she feels she is handling things well.  Follow.

## 2018-08-15 NOTE — Assessment & Plan Note (Signed)
Slightly elevated tsh on recent lab check.  Recheck tsh in the next 6 weeks to confirm normal.

## 2018-08-15 NOTE — Assessment & Plan Note (Signed)
Discussed treatment °

## 2018-08-17 ENCOUNTER — Encounter: Payer: Self-pay | Admitting: Internal Medicine

## 2018-08-17 MED ORDER — AMPHETAMINE-DEXTROAMPHET ER 10 MG PO CP24: 10.0000 mg | ORAL_CAPSULE | Freq: Every day | ORAL | 0 refills | Status: DC

## 2018-08-17 NOTE — Telephone Encounter (Signed)
rx sent in for adderall XR 10mg  #30 with no refills.

## 2018-08-22 ENCOUNTER — Encounter: Payer: Self-pay | Admitting: Internal Medicine

## 2018-09-05 ENCOUNTER — Other Ambulatory Visit: Payer: Self-pay | Admitting: Internal Medicine

## 2018-09-06 NOTE — Telephone Encounter (Signed)
Medication was previously prescribed by Dr. [name] who is no longer in practice in the area.

## 2018-09-06 NOTE — Telephone Encounter (Signed)
Pt was seeing Dr [name] and he was prescribing.  Now seeing Dr Bernardo Heater.  Have pharmacy request refill from Dr Bernardo Heater.  Let me know if any problems.

## 2018-09-10 ENCOUNTER — Telehealth: Payer: Self-pay | Admitting: Internal Medicine

## 2018-09-10 MED ORDER — AMPHETAMINE-DEXTROAMPHET ER 20 MG PO CP24: 20.0000 mg | ORAL_CAPSULE | Freq: Every day | ORAL | 0 refills | Status: DC

## 2018-09-10 NOTE — Telephone Encounter (Signed)
Pt was started on Adderall XR 10 mg q day the end of April. States it is not working and she is needing an increase

## 2018-09-10 NOTE — Telephone Encounter (Signed)
Adderall not working needing an increase in medication acting like a placebo. Wants to take the plain adderall instead of the XR.Needing to increase to 20 mg.

## 2018-09-10 NOTE — Telephone Encounter (Signed)
Patient says that she cannot tell she is taking medication at all. She is ok with staying on the XR and increasing. Stated that her daughter did better with the short active adderall and their metabolisms are very similar which is why she mentioned switching. Advised that the XR is what is recommended for her to use. Patient has one pill of the 10 mg left.

## 2018-09-10 NOTE — Telephone Encounter (Signed)
[facility] is going to send to Dr Bernardo Heater

## 2018-09-10 NOTE — Telephone Encounter (Signed)
rx ok'd for adderall XR 20mg  #30 with no refills.  (increased dose from 10mg  to 20mg ).

## 2018-09-10 NOTE — Telephone Encounter (Signed)
Need a little more information.  Per her note, she requested to change to the short active adderall and increase to 20mg .  Is it helping through part of the day and wearing off, etc?  It is recommended for her to use the extended release form and we can increase the XR dose if needed.

## 2018-09-11 ENCOUNTER — Other Ambulatory Visit: Payer: Self-pay

## 2018-09-12 MED ORDER — TAMSULOSIN HCL 0.4 MG PO CAPS: 0.4000 mg | ORAL_CAPSULE | Freq: Every day | ORAL | 1 refills | Status: DC

## 2018-09-27 ENCOUNTER — Other Ambulatory Visit: Payer: Self-pay

## 2018-10-03 ENCOUNTER — Other Ambulatory Visit: Payer: Self-pay | Admitting: Internal Medicine

## 2018-10-03 ENCOUNTER — Other Ambulatory Visit: Payer: Self-pay

## 2018-10-03 ENCOUNTER — Other Ambulatory Visit (INDEPENDENT_AMBULATORY_CARE_PROVIDER_SITE_OTHER): Payer: BC Managed Care – PPO

## 2018-10-03 DIAGNOSIS — R7989 Other specified abnormal findings of blood chemistry: Secondary | ICD-10-CM

## 2018-10-03 DIAGNOSIS — R739 Hyperglycemia, unspecified: Secondary | ICD-10-CM

## 2018-10-03 DIAGNOSIS — Z87442 Personal history of urinary calculi: Secondary | ICD-10-CM

## 2018-10-03 LAB — BASIC METABOLIC PANEL
BUN: 16 mg/dL (ref 6–23)
CO2: 30 mEq/L (ref 19–32)
Calcium: 9.6 mg/dL (ref 8.4–10.5)
Chloride: 99 mEq/L (ref 96–112)
Creatinine, Ser: 1.06 mg/dL (ref 0.40–1.20)
GFR: 52.56 mL/min — ABNORMAL LOW (ref >60.00)
Glucose, Bld: 115 mg/dL — ABNORMAL HIGH (ref 70–99)
Potassium: 3.9 mEq/L (ref 3.5–5.1)
Sodium: 139 mEq/L (ref 135–145)

## 2018-10-03 LAB — TSH: TSH: 3.41 u[IU]/mL (ref 0.35–4.50)

## 2018-10-03 LAB — URIC ACID: Uric Acid, Serum: 7.1 mg/dL — ABNORMAL HIGH (ref 2.4–7.0)

## 2018-10-04 ENCOUNTER — Encounter: Payer: Self-pay | Admitting: Internal Medicine

## 2018-10-05 NOTE — Telephone Encounter (Signed)
Refilled: 05/08/2018 Last OV: 08/13/2018 Next OV: 11/27/2018

## 2018-10-06 NOTE — Telephone Encounter (Signed)
rx sent in for librax

## 2018-10-17 ENCOUNTER — Other Ambulatory Visit: Payer: Self-pay | Admitting: Internal Medicine

## 2018-10-19 ENCOUNTER — Other Ambulatory Visit: Payer: Self-pay | Admitting: Internal Medicine

## 2018-10-19 NOTE — Telephone Encounter (Signed)
Last OV 08/13/2018 Next OV 11/27/2018 Last refill 09/10/2018

## 2018-10-20 ENCOUNTER — Other Ambulatory Visit: Payer: Self-pay | Admitting: Internal Medicine

## 2018-10-20 ENCOUNTER — Encounter: Payer: Self-pay | Admitting: Internal Medicine

## 2018-10-20 MED ORDER — AMPHETAMINE-DEXTROAMPHET ER 20 MG PO CP24: 20.0000 mg | ORAL_CAPSULE | Freq: Every day | ORAL | 0 refills | Status: DC

## 2018-10-22 ENCOUNTER — Other Ambulatory Visit: Payer: Self-pay | Admitting: Internal Medicine

## 2018-10-22 MED ORDER — ATORVASTATIN CALCIUM 20 MG PO TABS: 20.0000 mg | ORAL_TABLET | Freq: Every day | ORAL | 1 refills | Status: DC

## 2018-10-22 NOTE — Telephone Encounter (Signed)
rx sent in for lipitor #90 with one refill.

## 2018-11-12 DIAGNOSIS — N200 Calculus of kidney: Secondary | ICD-10-CM | POA: Diagnosis not present

## 2018-11-12 DIAGNOSIS — N183 Chronic kidney disease, stage 3 (moderate): Secondary | ICD-10-CM | POA: Diagnosis not present

## 2018-11-12 DIAGNOSIS — E876 Hypokalemia: Secondary | ICD-10-CM | POA: Diagnosis not present

## 2018-11-12 DIAGNOSIS — N390 Urinary tract infection, site not specified: Secondary | ICD-10-CM | POA: Diagnosis not present

## 2018-11-14 DIAGNOSIS — E876 Hypokalemia: Secondary | ICD-10-CM | POA: Diagnosis not present

## 2018-11-14 DIAGNOSIS — N2589 Other disorders resulting from impaired renal tubular function: Secondary | ICD-10-CM | POA: Diagnosis not present

## 2018-11-14 DIAGNOSIS — N183 Chronic kidney disease, stage 3 (moderate): Secondary | ICD-10-CM | POA: Diagnosis not present

## 2018-11-14 DIAGNOSIS — I10 Essential (primary) hypertension: Secondary | ICD-10-CM | POA: Diagnosis not present

## 2018-11-16 ENCOUNTER — Encounter: Payer: Self-pay | Admitting: Internal Medicine

## 2018-11-16 NOTE — Telephone Encounter (Signed)
Please call pt and see exactly what she is wanting to do with her medication.  She is currently taking extended release 20mg  q day.  Just need clarification.

## 2018-11-16 NOTE — Telephone Encounter (Signed)
Patient is wanting to switch from the extended release adderall to the regular adderall. She feels like the medication is helping but is keeping her up sometimes at night. Her daughter takes the regular adderall TID and suggested she may need to try that. She may not have to have the 3rd dose every day.

## 2018-11-17 MED ORDER — AMPHETAMINE-DEXTROAMPHETAMINE 5 MG PO TABS: 5.0000 mg | ORAL_TABLET | Freq: Three times a day (TID) | ORAL | 0 refills | Status: DC

## 2018-11-17 NOTE — Telephone Encounter (Signed)
X sent in for adderall 5mg  #90 with no refills.

## 2018-11-20 ENCOUNTER — Ambulatory Visit
Admission: RE | Admit: 2018-11-20 | Discharge: 2018-11-20 | Disposition: A | Discharge status: Home / Self Care | Payer: BC Managed Care – PPO | Source: Ambulatory Visit | Attending: Urology | Admitting: Urology

## 2018-11-20 ENCOUNTER — Other Ambulatory Visit: Payer: Self-pay

## 2018-11-20 DIAGNOSIS — N281 Cyst of kidney, acquired: Secondary | ICD-10-CM | POA: Diagnosis not present

## 2018-11-21 ENCOUNTER — Telehealth: Payer: Self-pay | Admitting: Urology

## 2018-11-21 DIAGNOSIS — R0981 Nasal congestion: Secondary | ICD-10-CM | POA: Diagnosis not present

## 2018-11-21 DIAGNOSIS — Z20828 Contact with and (suspected) exposure to other viral communicable diseases: Secondary | ICD-10-CM | POA: Diagnosis not present

## 2018-11-21 DIAGNOSIS — R05 Cough: Secondary | ICD-10-CM | POA: Diagnosis not present

## 2018-11-21 NOTE — Telephone Encounter (Signed)
Pt called office asking about her U/S results from yesterday.  She would also like for [place] to fax a copy to Dr. Juleen China @ [facility].

## 2018-11-21 NOTE — Telephone Encounter (Signed)
Ok to give results or should a virtual or in office visit be scheduled

## 2018-11-21 NOTE — Telephone Encounter (Signed)
The small cyst was visualized on renal ultrasound and size is stable.  The small nodule in the wall that was seen in 2016 was visualized.  No renal calculi were noted.  Would recommend a CT in 1 year to continue monitoring.  Okay to fax copy to [facility]

## 2018-11-22 NOTE — Telephone Encounter (Signed)
Patient notified, which CTscan should be ordered thanks

## 2018-11-27 ENCOUNTER — Encounter: Payer: Self-pay | Admitting: Internal Medicine

## 2018-11-27 ENCOUNTER — Ambulatory Visit (INDEPENDENT_AMBULATORY_CARE_PROVIDER_SITE_OTHER): Payer: BC Managed Care – PPO | Admitting: Internal Medicine

## 2018-11-27 DIAGNOSIS — F900 Attention-deficit hyperactivity disorder, predominantly inattentive type: Secondary | ICD-10-CM | POA: Diagnosis not present

## 2018-11-27 DIAGNOSIS — R7989 Other specified abnormal findings of blood chemistry: Secondary | ICD-10-CM | POA: Diagnosis not present

## 2018-11-27 DIAGNOSIS — Z87442 Personal history of urinary calculi: Secondary | ICD-10-CM

## 2018-11-27 DIAGNOSIS — E7800 Pure hypercholesterolemia, unspecified: Secondary | ICD-10-CM

## 2018-11-27 DIAGNOSIS — Z9109 Other allergy status, other than to drugs and biological substances: Secondary | ICD-10-CM | POA: Diagnosis not present

## 2018-11-27 DIAGNOSIS — N2889 Other specified disorders of kidney and ureter: Secondary | ICD-10-CM

## 2018-11-27 DIAGNOSIS — R739 Hyperglycemia, unspecified: Secondary | ICD-10-CM

## 2018-11-27 DIAGNOSIS — F439 Reaction to severe stress, unspecified: Secondary | ICD-10-CM

## 2018-11-27 DIAGNOSIS — N183 Chronic kidney disease, stage 3 unspecified: Secondary | ICD-10-CM
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
## 2018-12-02 ENCOUNTER — Encounter: Payer: Self-pay | Admitting: Internal Medicine

## 2018-12-02 DIAGNOSIS — N183 Chronic kidney disease, stage 3 unspecified: Secondary | ICD-10-CM | POA: Insufficient documentation

## 2018-12-02 NOTE — Assessment & Plan Note (Signed)
Seeing Dr Bernardo Heater.  Recently evaluated - stable.  Recommended f/u CT one year.  Pt had questions about f/u and renal function.

## 2018-12-02 NOTE — Assessment & Plan Note (Signed)
Increase stress as outlined.  Discussed with her today.  Overall she feels she is doing relatively well and does not need any further intervention.  Follow.

## 2018-12-02 NOTE — Assessment & Plan Note (Signed)
On adderall.  Adjust timing of dosing.  adderall 15mg  q day.  Follow.

## 2018-12-02 NOTE — Assessment & Plan Note (Signed)
Controlled on current regimen.   

## 2018-12-02 NOTE — Assessment & Plan Note (Addendum)
Seeing Dr Rolly Salter.   Discussed the need to try to avoid antiinflammatories.  Continue f/u with nephrology.

## 2018-12-02 NOTE — Assessment & Plan Note (Signed)
Followed by urology.   

## 2018-12-02 NOTE — Assessment & Plan Note (Signed)
Low carb diet and exercise.  Follow met b and a1c.  

## 2018-12-02 NOTE — Assessment & Plan Note (Signed)
On lipitor.  Low cholesterol diet and exercise.  Follow lipid panel and liver function tests.   

## 2018-12-02 NOTE — Assessment & Plan Note (Signed)
Recheck tsh wnl.  Follow.

## 2018-12-03 NOTE — Telephone Encounter (Signed)
-----   Message from Abbie Sons, MD sent at 12/03/2018  7:13 AM EDT ----- Regarding: RE: follow up Sounds good.  Thanks for your help ----- Message ----- From: Einar Pheasant, MD Sent: 12/02/2018   4:00 AM EDT To: Abbie Sons, MD Subject: follow up                                      I recently saw Ms Lauman for a scheduled follow up.  She had questions about her renal ultrasound.  She was questioning me regarding follow up.  I reviewed your recent note regarding her ultrasound - cyst stable and "small nodule seen 2016".  I discussed with her that you recommended follow up CT scan in one year.  She is followed by Dr Abigail Butts for CKD.  I will continue to follow her kidney function as well.  Let me know if there is anything more I need to do.  Thank you for all of your help.    Einar Pheasant

## 2018-12-18 ENCOUNTER — Other Ambulatory Visit: Payer: BC Managed Care – PPO

## 2018-12-20 ENCOUNTER — Other Ambulatory Visit: Payer: Self-pay | Admitting: Internal Medicine

## 2018-12-21 ENCOUNTER — Other Ambulatory Visit: Payer: Self-pay

## 2018-12-21 DIAGNOSIS — R339 Retention of urine, unspecified: Secondary | ICD-10-CM

## 2018-12-21 MED ORDER — TAMSULOSIN HCL 0.4 MG PO CAPS: 0.4000 mg | ORAL_CAPSULE | Freq: Every day | ORAL | 1 refills | Status: DC

## 2018-12-24 NOTE — Telephone Encounter (Signed)
Last OV 11/27/2018 Next OV 02/25/19 Last refill 11/17/18

## 2018-12-24 NOTE — Telephone Encounter (Signed)
rx ok'd for adderal and celebrex.

## 2019-01-04 ENCOUNTER — Other Ambulatory Visit: Payer: Self-pay

## 2019-01-04 ENCOUNTER — Other Ambulatory Visit (INDEPENDENT_AMBULATORY_CARE_PROVIDER_SITE_OTHER): Payer: BC Managed Care – PPO

## 2019-01-04 DIAGNOSIS — R7989 Other specified abnormal findings of blood chemistry: Secondary | ICD-10-CM | POA: Diagnosis not present

## 2019-01-04 DIAGNOSIS — R739 Hyperglycemia, unspecified: Secondary | ICD-10-CM | POA: Diagnosis not present

## 2019-01-04 DIAGNOSIS — E7800 Pure hypercholesterolemia, unspecified: Secondary | ICD-10-CM | POA: Diagnosis not present

## 2019-01-04 LAB — LIPID PANEL
Cholesterol: 161 mg/dL (ref 0–200)
HDL: 48.7 mg/dL (ref >39.00)
NonHDL: 112.13
Total CHOL/HDL Ratio: 3
Triglycerides: 213 mg/dL — ABNORMAL HIGH (ref 0.0–149.0)
VLDL: 42.6 mg/dL — ABNORMAL HIGH (ref 0.0–40.0)

## 2019-01-04 LAB — CBC WITH DIFFERENTIAL/PLATELET
Basophils Absolute: 0.1 10*3/uL (ref 0.0–0.1)
Basophils Relative: 0.9 % (ref 0.0–3.0)
Eosinophils Absolute: 0.2 10*3/uL (ref 0.0–0.7)
Eosinophils Relative: 2.7 % (ref 0.0–5.0)
HCT: 41.6 % (ref 36.0–46.0)
Hemoglobin: 14.3 g/dL (ref 12.0–15.0)
Lymphocytes Relative: 36.4 % (ref 12.0–46.0)
Lymphs Abs: 2.3 10*3/uL (ref 0.7–4.0)
MCHC: 34.3 g/dL (ref 30.0–36.0)
MCV: 87.2 fl (ref 78.0–100.0)
Monocytes Absolute: 0.6 10*3/uL (ref 0.1–1.0)
Monocytes Relative: 9.1 % (ref 3.0–12.0)
Neutro Abs: 3.2 10*3/uL (ref 1.4–7.7)
Neutrophils Relative %: 50.9 % (ref 43.0–77.0)
Platelets: 252 10*3/uL (ref 150.0–400.0)
RBC: 4.77 Mil/uL (ref 3.87–5.11)
RDW: 13.1 % (ref 11.5–15.5)
WBC: 6.3 10*3/uL (ref 4.0–10.5)

## 2019-01-04 LAB — BASIC METABOLIC PANEL
BUN: 17 mg/dL (ref 6–23)
CO2: 32 mEq/L (ref 19–32)
Calcium: 9.3 mg/dL (ref 8.4–10.5)
Chloride: 101 mEq/L (ref 96–112)
Creatinine, Ser: 1.03 mg/dL (ref 0.40–1.20)
GFR: 54.29 mL/min — ABNORMAL LOW (ref >60.00)
Glucose, Bld: 111 mg/dL — ABNORMAL HIGH (ref 70–99)
Potassium: 3.5 mEq/L (ref 3.5–5.1)
Sodium: 142 mEq/L (ref 135–145)

## 2019-01-04 LAB — HEPATIC FUNCTION PANEL
ALT: 20 U/L (ref 0–35)
AST: 16 U/L (ref 0–37)
Albumin: 4.3 g/dL (ref 3.5–5.2)
Alkaline Phosphatase: 61 U/L (ref 39–117)
Bilirubin, Direct: 0.1 mg/dL (ref 0.0–0.3)
Total Bilirubin: 0.8 mg/dL (ref 0.2–1.2)
Total Protein: 6.6 g/dL (ref 6.0–8.3)

## 2019-01-04 LAB — HEMOGLOBIN A1C: Hgb A1c MFr Bld: 5.9 % (ref 4.6–6.5)

## 2019-01-04 LAB — TSH: TSH: 4.71 u[IU]/mL — ABNORMAL HIGH (ref 0.35–4.50)

## 2019-01-04 LAB — LDL CHOLESTEROL, DIRECT: Direct LDL: 85 mg/dL

## 2019-01-07 ENCOUNTER — Telehealth: Payer: Self-pay | Admitting: Internal Medicine

## 2019-01-07 NOTE — Telephone Encounter (Signed)
Patient is calling to receive her lab results from 01/04/2019 Please advise CB-  [phone] Patient does use MyChart and can communicate via mychart

## 2019-01-08 ENCOUNTER — Other Ambulatory Visit: Payer: Self-pay | Admitting: Internal Medicine

## 2019-01-08 DIAGNOSIS — R7989 Other specified abnormal findings of blood chemistry: Secondary | ICD-10-CM

## 2019-01-08 NOTE — Progress Notes (Signed)
Order placed for f/u tsh.  

## 2019-02-08 ENCOUNTER — Other Ambulatory Visit: Payer: Self-pay | Admitting: Internal Medicine

## 2019-02-12 NOTE — Telephone Encounter (Signed)
rx sent in for adderall.

## 2019-02-21 IMAGING — CT CT HEART SCORING
2 series · 16 of 20 positions shown, 18 images · non-contrast
Comparison: None.

CLINICAL DATA: Risk stratification

EXAM:
Coronary Calcium Score
TECHNIQUE: The patient was scanned on a Siemens Somatom 64 slice scanner. Axial
non-contrast 3 mm slices were carried out through the heart. The
data set was analyzed on a dedicated work station and scored using
the Agatson method.

[Series 2: casc 3.0 i36f 2 bestdiast 64 % · axial · 0.31mm/px · z∈[-212,-137]mm · 8 of 33 slices shown, 10 images]
[im 4/33  vessel]
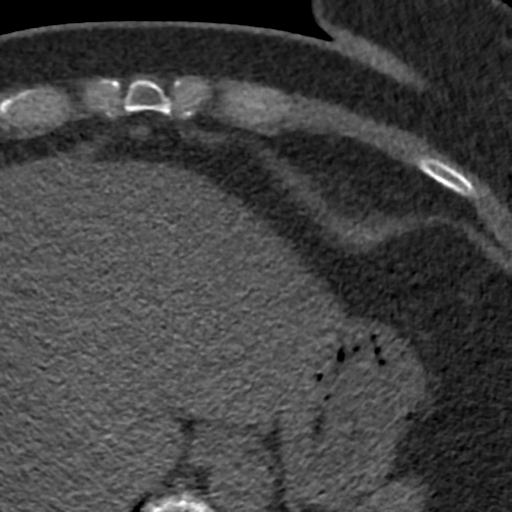
[im 4/33  lung]
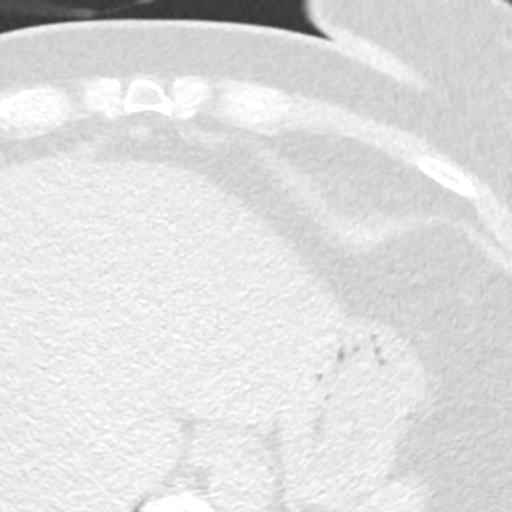
[im 8/33  vessel]
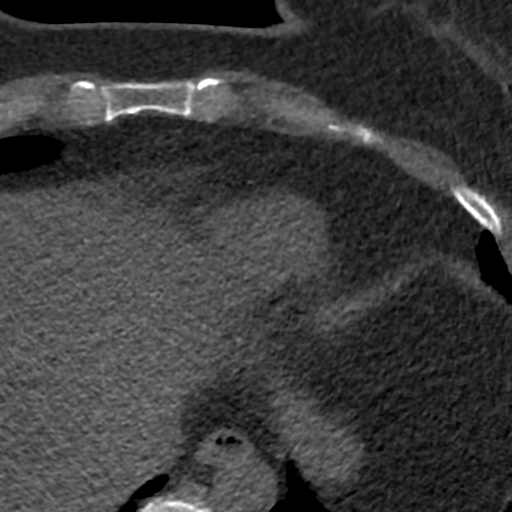
[im 11/33  vessel]
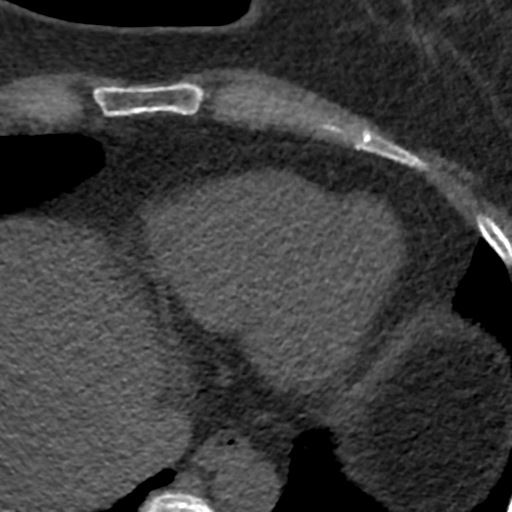
[im 15/33  vessel]
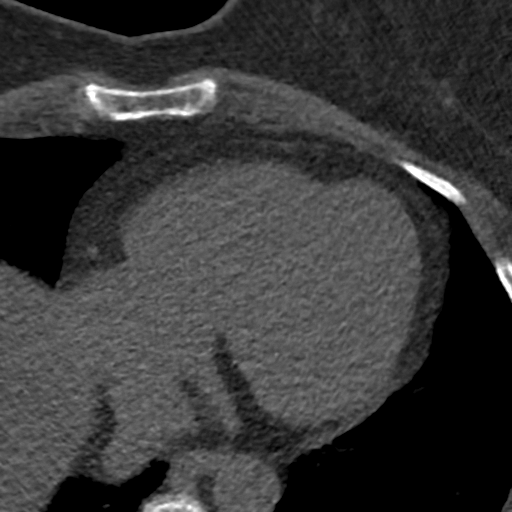
[im 18/33  vessel]
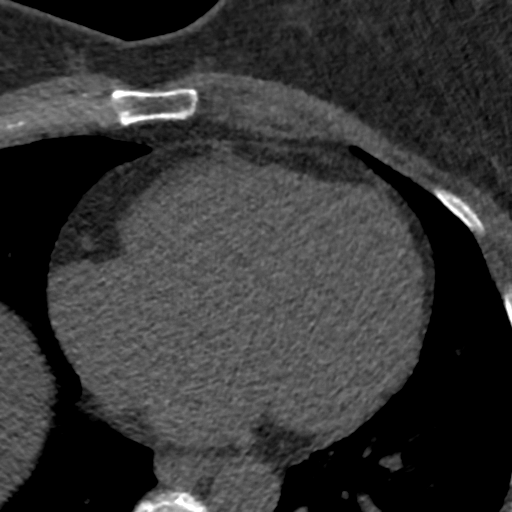
[im 18/33  lung]
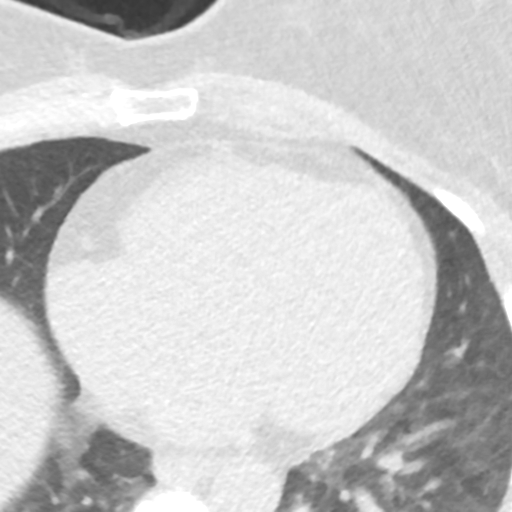
[im 22/33  vessel]
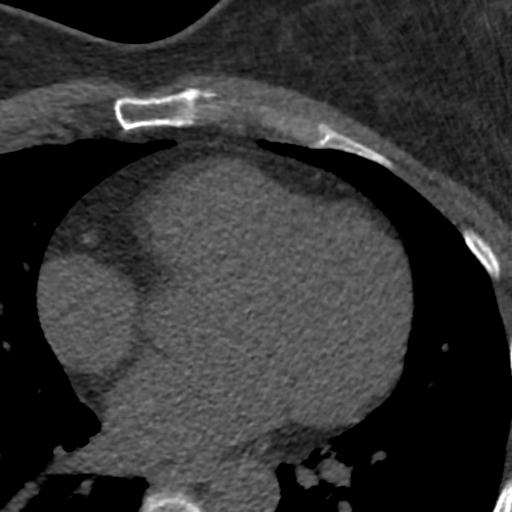
[im 25/33  vessel]
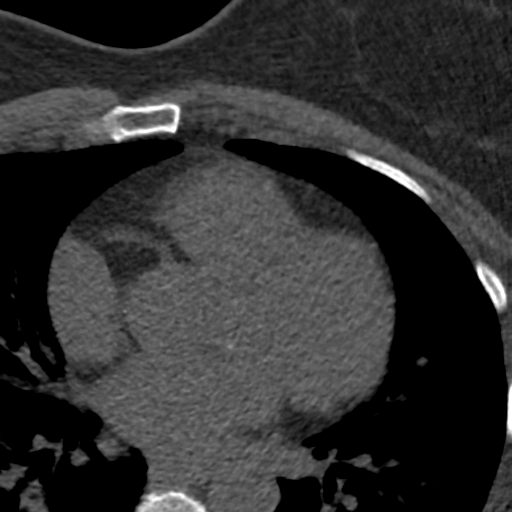
[im 29/33  vessel]
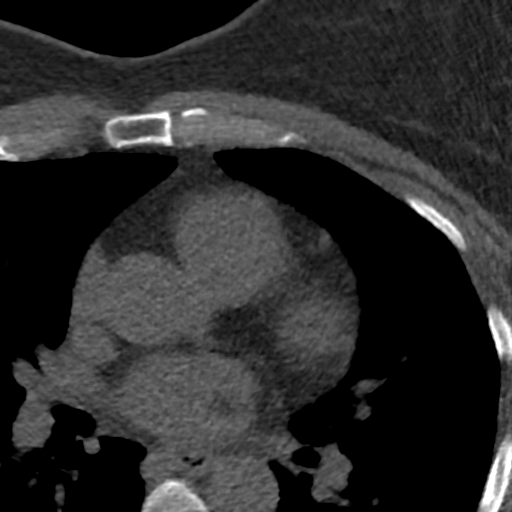

[Series 4: lung st 70 % · axial · 0.62mm/px · z∈[-212,-136]mm · 8 of 33 slices shown]
[im 4/33  lung]
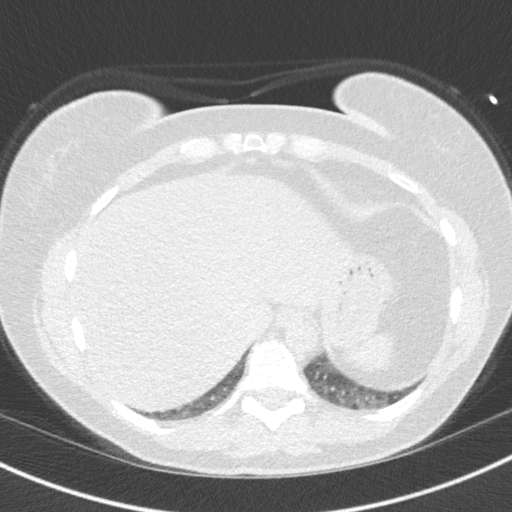
[im 8/33  lung]
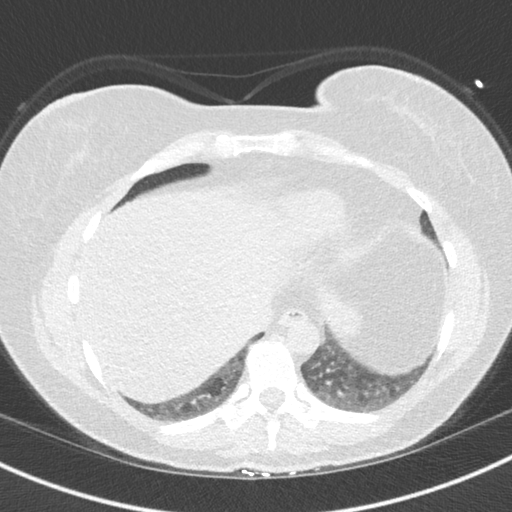
[im 11/33  lung]
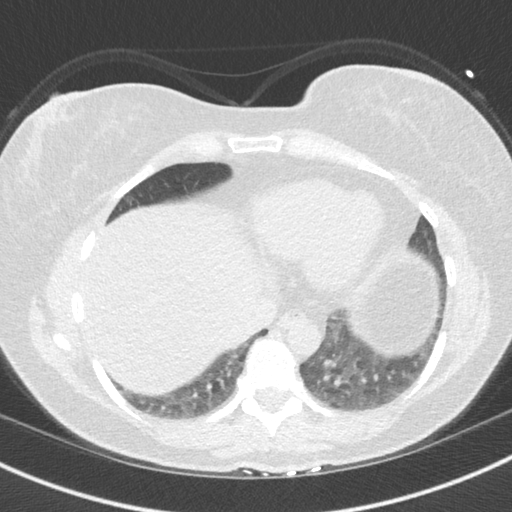
[im 15/33  lung]
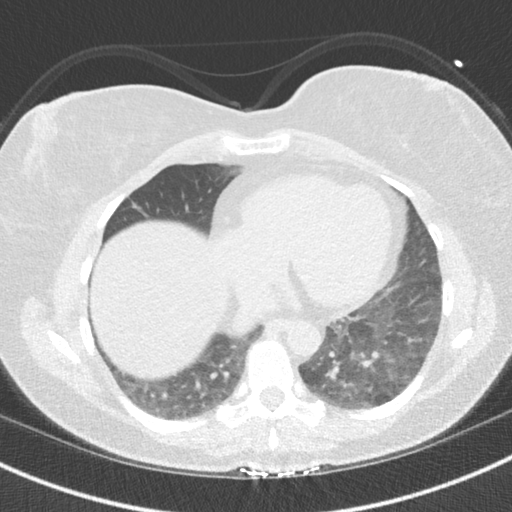
[im 18/33  lung]
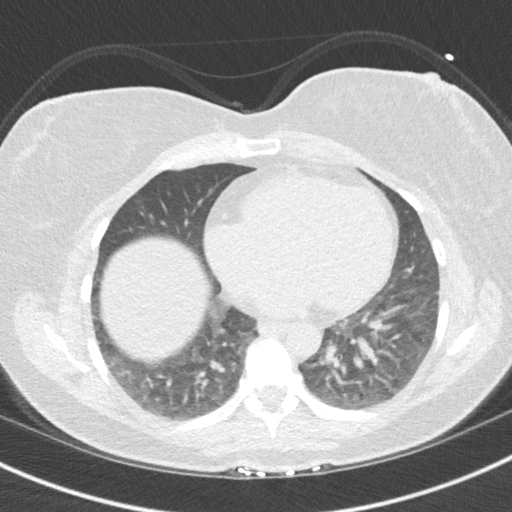
[im 22/33  lung]
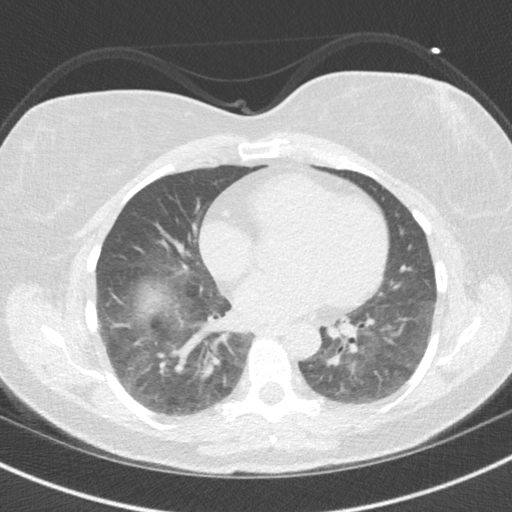
[im 25/33  lung]
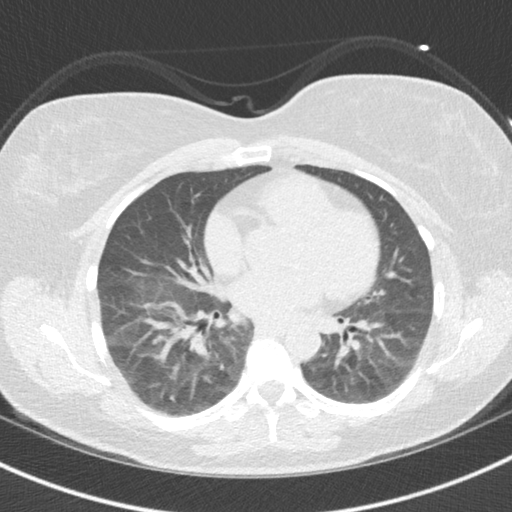
[im 29/33  lung]
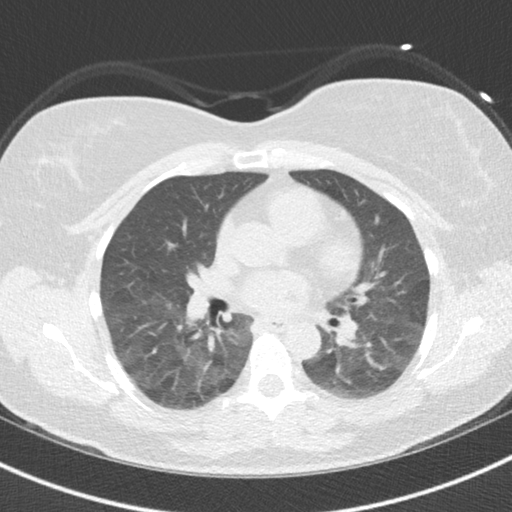

[16 of 20 positions shown; findings below may reference images not displayed]

FINDINGS: Non-cardiac: See separate report from [REDACTED].

Ascending Aorta: Normal diameter 2.9 cm

Pericardium: Normal

Coronary arteries: No calcium detected
IMPRESSION: Coronary calcium score of 0.

[address]

EXAM:
OVER-READ INTERPRETATION  CT CHEST

The following report is an over-read performed by radiologist Dr.
Celenia Noda [REDACTED] on 07/10/2017. This
over-read does not include interpretation of cardiac or coronary
anatomy or pathology. The calcium score interpretation by the
cardiologist is attached.
FINDINGS: Limited view of the lung parenchyma demonstrates no suspicious
nodularity. Airways are normal.

Limited view of the mediastinum demonstrates no adenopathy.
Esophagus normal.

Limited view of the upper abdomen unremarkable.

Limited view of the skeleton and chest wall is unremarkable.
IMPRESSION: No significant extracardiac findings.

## 2019-02-22 ENCOUNTER — Telehealth: Payer: Self-pay | Admitting: Internal Medicine

## 2019-02-22 NOTE — Telephone Encounter (Signed)
I have called pt twice to confirm appt and to go over travel screening for 11/2.  Pt hasn't called back

## 2019-02-22 NOTE — Telephone Encounter (Signed)
Pt reschedule to 04/2019

## 2019-02-25 ENCOUNTER — Encounter: Payer: BC Managed Care – PPO | Admitting: Internal Medicine

## 2019-03-03 ENCOUNTER — Other Ambulatory Visit: Payer: Self-pay | Admitting: Internal Medicine

## 2019-03-13 ENCOUNTER — Telehealth: Payer: Self-pay

## 2019-03-13 ENCOUNTER — Other Ambulatory Visit (INDEPENDENT_AMBULATORY_CARE_PROVIDER_SITE_OTHER): Payer: BC Managed Care – PPO

## 2019-03-13 ENCOUNTER — Other Ambulatory Visit: Payer: Self-pay | Admitting: Internal Medicine

## 2019-03-13 ENCOUNTER — Other Ambulatory Visit: Payer: Self-pay

## 2019-03-13 DIAGNOSIS — R7989 Other specified abnormal findings of blood chemistry: Secondary | ICD-10-CM

## 2019-03-13 DIAGNOSIS — E876 Hypokalemia: Secondary | ICD-10-CM

## 2019-03-13 NOTE — Progress Notes (Signed)
Order placed for add on lab - met b.  

## 2019-03-13 NOTE — Telephone Encounter (Signed)
I was not in the office this pm.  I have ordered a met b to add on to blood drawn yesterday.  Please add.  Thanks

## 2019-03-13 NOTE — Telephone Encounter (Signed)
Judith Rios requested her K+ level be added to her labs during her visit today.  I informed her the test had not been ordered with her labs today likely because her last level was normal.  She states it has been fluctuating over time and asked once again if I could add the test on. I did draw an extra tube and let her know I would ask when time allowed.  Thank you!

## 2019-03-14 LAB — TSH: TSH: 3.55 u[IU]/mL (ref 0.35–4.50)

## 2019-03-14 NOTE — Telephone Encounter (Signed)
Add on sheet faxed 

## 2019-03-15 ENCOUNTER — Other Ambulatory Visit (INDEPENDENT_AMBULATORY_CARE_PROVIDER_SITE_OTHER): Payer: BC Managed Care – PPO

## 2019-03-15 ENCOUNTER — Encounter: Payer: Self-pay | Admitting: Internal Medicine

## 2019-03-15 DIAGNOSIS — E876 Hypokalemia: Secondary | ICD-10-CM | POA: Diagnosis not present

## 2019-03-15 LAB — BASIC METABOLIC PANEL
BUN: 19 mg/dL (ref 6–23)
CO2: 33 mEq/L — ABNORMAL HIGH (ref 19–32)
Calcium: 10.1 mg/dL (ref 8.4–10.5)
Chloride: 100 mEq/L (ref 96–112)
Creatinine, Ser: 1.09 mg/dL (ref 0.40–1.20)
GFR: 50.82 mL/min — ABNORMAL LOW (ref >60.00)
Glucose, Bld: 95 mg/dL (ref 70–99)
Potassium: 3.9 mEq/L (ref 3.5–5.1)
Sodium: 143 mEq/L (ref 135–145)

## 2019-03-16 ENCOUNTER — Encounter: Payer: Self-pay | Admitting: Internal Medicine

## 2019-04-02 ENCOUNTER — Telehealth: Payer: Self-pay | Admitting: Internal Medicine

## 2019-04-02 NOTE — Telephone Encounter (Signed)
I will call and triage. Do you want to work her in this PM or add on to tomorrow?

## 2019-04-02 NOTE — Telephone Encounter (Signed)
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

## 2019-04-02 NOTE — Telephone Encounter (Signed)
Pt stated that she needs antibiotic today. She is having head congestion, sore throat, teeth are sore, post nasal drip, drainage-green/yellowish phlegm, jaw pain. Symptoms x 1 week. Advised that we do not have any appointments until tomorrow. Patient stated that she really did not think she should wait because her face is swollen. Advised that if she is having facial swelling, she needs to go to UC today to be evaluated. Pt stated that her face isn't really swollen and that if the pain becomes unbearable she will go to UC. Scheduled virtual for tomorrow. Pt stated that she would take appt but she has some left over antibiotics that she has already started taking. Pt stated she knows that this is a sinus infection. She is 100% sure she does not have COVID.

## 2019-04-02 NOTE — Telephone Encounter (Signed)
Copied from [place] [id number] [phone]. Topic: General - Other >> Apr 02, 2019  1:45 PM Keene Breath wrote: Reason for CRM: Patient called to ask the nurse to call in a script for an antibiotic for her sinus infection.  She stated that she has sinusitis and is in a lot of pain and needs an antibiotic.  Please advise and call to discuss at [phone]

## 2019-04-03 ENCOUNTER — Ambulatory Visit (INDEPENDENT_AMBULATORY_CARE_PROVIDER_SITE_OTHER): Payer: BC Managed Care – PPO | Admitting: Internal Medicine

## 2019-04-03 ENCOUNTER — Other Ambulatory Visit: Payer: Self-pay

## 2019-04-03 DIAGNOSIS — R0981 Nasal congestion: Secondary | ICD-10-CM | POA: Diagnosis not present

## 2019-04-03 DIAGNOSIS — J019 Acute sinusitis, unspecified: Secondary | ICD-10-CM | POA: Diagnosis not present

## 2019-04-03 DIAGNOSIS — Z20828 Contact with and (suspected) exposure to other viral communicable diseases: Secondary | ICD-10-CM | POA: Diagnosis not present

## 2019-04-03 LAB — NOVEL CORONAVIRUS, NAA: SARS-CoV-2, NAA: NEGATIVE

## 2019-04-03 MED ORDER — FLUTICASONE PROPIONATE 50 MCG/ACT NA SUSP: NASAL | 2 refills | Status: DC

## 2019-04-03 MED ORDER — AMOXICILLIN-POT CLAVULANATE 875-125 MG PO TABS: 1.0000 | ORAL_TABLET | Freq: Two times a day (BID) | ORAL | 0 refills | Status: DC
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
## 2019-04-07 ENCOUNTER — Encounter: Payer: Self-pay | Admitting: Internal Medicine

## 2019-04-07 DIAGNOSIS — J329 Chronic sinusitis, unspecified: Secondary | ICD-10-CM | POA: Insufficient documentation

## 2019-04-07 NOTE — Assessment & Plan Note (Signed)
Symptoms consistent with sinus infection.  Continue flonase.  Mucinex/robitussin.  Saline nasla spray.  augmentin as directed.  Follow.

## 2019-04-10 ENCOUNTER — Ambulatory Visit: Payer: BLUE CROSS/BLUE SHIELD | Admitting: Urology

## 2019-04-10 ENCOUNTER — Encounter: Payer: Self-pay | Admitting: Internal Medicine

## 2019-05-03 ENCOUNTER — Encounter: Payer: Self-pay | Admitting: Internal Medicine

## 2019-05-03 ENCOUNTER — Ambulatory Visit (INDEPENDENT_AMBULATORY_CARE_PROVIDER_SITE_OTHER): Payer: BC Managed Care – PPO | Admitting: Internal Medicine

## 2019-05-03 ENCOUNTER — Other Ambulatory Visit: Payer: Self-pay

## 2019-05-03 DIAGNOSIS — N183 Chronic kidney disease, stage 3 unspecified: Secondary | ICD-10-CM | POA: Diagnosis not present

## 2019-05-03 DIAGNOSIS — R739 Hyperglycemia, unspecified: Secondary | ICD-10-CM

## 2019-05-03 DIAGNOSIS — E7800 Pure hypercholesterolemia, unspecified: Secondary | ICD-10-CM

## 2019-05-03 DIAGNOSIS — N2889 Other specified disorders of kidney and ureter: Secondary | ICD-10-CM

## 2019-05-03 DIAGNOSIS — F900 Attention-deficit hyperactivity disorder, predominantly inattentive type: Secondary | ICD-10-CM | POA: Diagnosis not present

## 2019-05-03 DIAGNOSIS — R7989 Other specified abnormal findings of blood chemistry: Secondary | ICD-10-CM

## 2019-05-03 DIAGNOSIS — F439 Reaction to severe stress, unspecified: Secondary | ICD-10-CM
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
## 2019-05-04 ENCOUNTER — Encounter: Payer: Self-pay | Admitting: Internal Medicine

## 2019-05-04 NOTE — Assessment & Plan Note (Signed)
Has been evaluated by Dr Bernardo Heater.  Stable.  Recommended f/u CT in one year.  Schedule for f/u metabolic panel to assess renal function.

## 2019-05-04 NOTE — Assessment & Plan Note (Signed)
Low carb diet and exercise.  Follow met b and a1c.  

## 2019-05-04 NOTE — Assessment & Plan Note (Signed)
Not on adderall now.  Doing well off medication.  Does not feel needs anything at this time.  Follow.

## 2019-05-04 NOTE — Assessment & Plan Note (Signed)
Has seen Dr Rolly Salter.  Avoid antiinflammatories.  Follow renal function.

## 2019-05-04 NOTE — Assessment & Plan Note (Signed)
On lipitor.  Low cholesterol diet and exercise.  Follow lipid panel and liver function tests.   

## 2019-05-04 NOTE — Assessment & Plan Note (Signed)
Increased stress as outlined.  Discussed with her today. Overall appears to be handling things relatively well.  Hold on additional medication.  Follow.  °

## 2019-05-04 NOTE — Assessment & Plan Note (Signed)
Reviewed labs.  Recent tsh wnl.

## 2019-05-06 ENCOUNTER — Telehealth: Payer: Self-pay | Admitting: Internal Medicine

## 2019-05-06 NOTE — Telephone Encounter (Signed)
Lm for pt to call back and schedule fasting labs in the next 1-2 weeks and a CPE in 78m

## 2019-05-10 ENCOUNTER — Telehealth (INDEPENDENT_AMBULATORY_CARE_PROVIDER_SITE_OTHER): Payer: BC Managed Care – PPO | Admitting: Urology

## 2019-05-10 ENCOUNTER — Other Ambulatory Visit: Payer: Self-pay

## 2019-05-10 DIAGNOSIS — N281 Cyst of kidney, acquired: Secondary | ICD-10-CM | POA: Diagnosis not present

## 2019-05-10 DIAGNOSIS — R339 Retention of urine, unspecified: Secondary | ICD-10-CM

## 2019-05-10 DIAGNOSIS — Z87442 Personal history of urinary calculi: Secondary | ICD-10-CM

## 2019-05-10 NOTE — Progress Notes (Signed)
Virtual Visit via Telephone Note  I connected with Dellia Beckwith on 05/10/19 at  3:00 PM EST by telephone and verified that I am speaking with the correct person using two identifiers.  I was unable to obtain a video connection.  Location: Patient: Home/work Provider: Office   I discussed the limitations, risks, security and privacy concerns of performing an evaluation and management service by telephone and the availability of in person appointments. I also discussed with the patient that there may be a patient responsible charge related to this service. The patient expressed understanding and agreed to proceed.  Urologic history: 1.  Bosniak 58F left renal cyst - CT/RUS showing simple left renal cyst dating back to 2009-2015 - Abdominal ultrasound 02/2015 showing 61mm left renal cyst with a possible 7 mm mural nodule - Renal mass CT 03/2015 17 mm Bosniak 58F renal cyst with slight wall nodularity; 6 mm right renal calculus - MRI January 2018 2 cm left renal cyst; prior potential area of nodularity was not visualized; no abnormal enhancement - CT 03/2017 15 mm complex cyst without enhancement favoring Bosniak 2 hemorrhagic cyst - RUS 08/2017 previously noted renal cyst was not visualized and was seen on prior ultrasound   2.  Nephrolithiasis/recurrent stone disease - Left ureteroscopy/stone removal 10/2016 - RUS 08/2017 showed no calculi   3.  Lower urinary tract symptoms -On tamsulosin 0.4 mg daily  History of Present Illness: I last saw [facility] December 2019.  She has done well since her visit last year.  Denies flank/abdominal pain.  She has no gross hematuria.  Renal ultrasound performed July 2020 showed a stable Bosniak 57F left renal cyst  She denies bothersome lower urinary tract symptoms and remains on tamsulosin.  She remains on hydrochlorothiazide for hypercalciuria   Observations/Objective: N/A  Assessment and Plan: -Bosniak 58F renal cyst  Stable since November 2016.   Schedule follow-up renal ultrasound 1 year.  -Recurrent stone disease  Recent renal ultrasound showed no calculi.  Continue HCTZ-refill sent  -Lower urinary tract symptoms  Continue tamsulosin-refill sent  Follow Up Instructions: 1 year with renal ultrasound   I discussed the assessment and treatment plan with the patient. The patient was provided an opportunity to ask questions and all were answered. The patient agreed with the plan and demonstrated an understanding of the instructions.   The patient was advised to call back or seek an in-person evaluation if the symptoms worsen or if the condition fails to improve as anticipated.  I provided 5 minutes of non-face-to-face time during this encounter.   Abbie Sons, MD

## 2019-05-13 MED ORDER — HYDROCHLOROTHIAZIDE 50 MG PO TABS: 50.0000 mg | ORAL_TABLET | Freq: Every day | ORAL | 3 refills | Status: DC

## 2019-05-13 MED ORDER — TAMSULOSIN HCL 0.4 MG PO CAPS: 0.4000 mg | ORAL_CAPSULE | Freq: Every day | ORAL | 3 refills | Status: DC

## 2019-05-15 DIAGNOSIS — I10 Essential (primary) hypertension: Secondary | ICD-10-CM | POA: Diagnosis not present

## 2019-05-15 DIAGNOSIS — N183 Chronic kidney disease, stage 3 unspecified: Secondary | ICD-10-CM | POA: Diagnosis not present

## 2019-05-31 ENCOUNTER — Telehealth: Payer: Self-pay | Admitting: Internal Medicine

## 2019-05-31 NOTE — Telephone Encounter (Signed)
Pt needs refill on celecoxib (CELEBREX) 200 MG capsule 90 day supply, clidinium-chlordiazePOXIDE (LIBRAX) 5-2.5 MG capsule 90 day supply and fluticasone (FLONASE) 50 MCG/ACT nasal spray and atorvastatin (LIPITOR) 20 MG tablet.

## 2019-06-03 ENCOUNTER — Other Ambulatory Visit: Payer: Self-pay

## 2019-06-03 MED ORDER — CELECOXIB 200 MG PO CAPS: 200.0000 mg | ORAL_CAPSULE | Freq: Every day | ORAL | 0 refills | Status: DC

## 2019-06-03 MED ORDER — FLUTICASONE PROPIONATE 50 MCG/ACT NA SUSP: NASAL | 2 refills | Status: DC

## 2019-06-03 MED ORDER — ATORVASTATIN CALCIUM 20 MG PO TABS: 20.0000 mg | ORAL_TABLET | Freq: Every day | ORAL | 1 refills | Status: DC

## 2019-06-03 MED ORDER — CILIDINIUM-CHLORDIAZEPOXIDE 2.5-5 MG PO CAPS: ORAL_CAPSULE | ORAL | 0 refills | Status: DC

## 2019-06-03 NOTE — Telephone Encounter (Signed)
Rx sent in

## 2019-06-06 ENCOUNTER — Other Ambulatory Visit: Payer: Self-pay | Admitting: Internal Medicine

## 2019-06-07 ENCOUNTER — Other Ambulatory Visit: Payer: Self-pay | Admitting: Lab

## 2019-06-07 NOTE — Telephone Encounter (Signed)
Last OV 05/03/19 Next OV none Last refill 03/05/19  It looks like it was refilled on 06/03/19 but I printed it by mistake. I have shredded and resent to you to send in electronically

## 2019-08-19 DIAGNOSIS — H538 Other visual disturbances: Secondary | ICD-10-CM | POA: Diagnosis not present

## 2019-09-16 ENCOUNTER — Other Ambulatory Visit: Payer: Self-pay | Admitting: Internal Medicine

## 2019-09-16 NOTE — Telephone Encounter (Signed)
Refill request for librax, last seen 05-03-19, last filled 06-08-19.  Please advise.

## 2019-09-17 NOTE — Telephone Encounter (Signed)
rx ok'd for librax.

## 2019-10-10 ENCOUNTER — Telehealth: Payer: Self-pay | Admitting: Internal Medicine

## 2019-10-10 NOTE — Telephone Encounter (Signed)
Received notice that pt is overdue mammogram.  Need to schedule.    Thanks   Dr Nicki Reaper

## 2019-10-10 NOTE — Telephone Encounter (Signed)
Mychart sent to pt.

## 2019-11-05 ENCOUNTER — Other Ambulatory Visit: Payer: Self-pay | Admitting: Internal Medicine

## 2019-11-21 ENCOUNTER — Telehealth: Payer: Self-pay | Admitting: Internal Medicine

## 2019-11-21 NOTE — Telephone Encounter (Signed)
Pt wanted a call back about the covid vaccine

## 2019-11-22 NOTE — Telephone Encounter (Signed)
She is due a f/u appt and I can discuss vaccine with her at her appt.

## 2019-11-22 NOTE — Telephone Encounter (Signed)
Pt stated that she had discussed getting the vaccine in the past and you recommended her hold off on getting it and also consulting with Dr Abigail Butts due to her kidneys. Dr Marshia Ly nurse stated that if exemption documentation had to be filled out, it had to be completed by PCP. Pt is ok with getting vaccine but has been told by 2 providers that she should not because of her health conditions.

## 2019-11-25 NOTE — Telephone Encounter (Signed)
Left detailed messafe for patient to call back and schedule appt with Dr Nicki Reaper. Please schedule.

## 2019-11-27 ENCOUNTER — Other Ambulatory Visit: Payer: BC Managed Care – PPO

## 2019-11-28 ENCOUNTER — Telehealth (INDEPENDENT_AMBULATORY_CARE_PROVIDER_SITE_OTHER): Payer: BC Managed Care – PPO | Admitting: Internal Medicine

## 2019-11-28 DIAGNOSIS — G479 Sleep disorder, unspecified: Secondary | ICD-10-CM | POA: Diagnosis not present

## 2019-11-28 DIAGNOSIS — Z1152 Encounter for screening for COVID-19: Secondary | ICD-10-CM

## 2019-11-28 DIAGNOSIS — Z0184 Encounter for antibody response examination: Secondary | ICD-10-CM

## 2019-11-28 DIAGNOSIS — N183 Chronic kidney disease, stage 3 unspecified: Secondary | ICD-10-CM

## 2019-11-28 DIAGNOSIS — R7989 Other specified abnormal findings of blood chemistry: Secondary | ICD-10-CM

## 2019-11-28 DIAGNOSIS — E7800 Pure hypercholesterolemia, unspecified: Secondary | ICD-10-CM

## 2019-11-28 DIAGNOSIS — R739 Hyperglycemia, unspecified: Secondary | ICD-10-CM | POA: Diagnosis not present
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
## 2019-11-30 ENCOUNTER — Encounter: Payer: Self-pay | Admitting: Internal Medicine

## 2019-11-30 NOTE — Assessment & Plan Note (Signed)
On hydroxyzine.  Discussed adjusting timing of her medication rather than increasing daily dose.  She is agreeable.  Follow.

## 2019-11-30 NOTE — Assessment & Plan Note (Signed)
Recheck tsh with next labs.   

## 2019-11-30 NOTE — Addendum Note (Signed)
Addended by: Alisa Graff on: 11/30/2019 08:16 PM   Modules accepted: Orders

## 2019-11-30 NOTE — Assessment & Plan Note (Signed)
Sees Dr Rolly Salter.  D/w him regarding covid vaccine.

## 2019-11-30 NOTE — Assessment & Plan Note (Signed)
Check met b and a1c with next labs.

## 2019-11-30 NOTE — Assessment & Plan Note (Signed)
On lipitor.  Check lipid panel and liver function tests with next labs.

## 2019-12-03 ENCOUNTER — Other Ambulatory Visit (INDEPENDENT_AMBULATORY_CARE_PROVIDER_SITE_OTHER): Payer: BC Managed Care – PPO

## 2019-12-03 ENCOUNTER — Other Ambulatory Visit: Payer: Self-pay

## 2019-12-03 DIAGNOSIS — R7989 Other specified abnormal findings of blood chemistry: Secondary | ICD-10-CM

## 2019-12-03 DIAGNOSIS — N183 Chronic kidney disease, stage 3 unspecified: Secondary | ICD-10-CM | POA: Diagnosis not present

## 2019-12-03 DIAGNOSIS — R739 Hyperglycemia, unspecified: Secondary | ICD-10-CM | POA: Diagnosis not present

## 2019-12-03 DIAGNOSIS — E7800 Pure hypercholesterolemia, unspecified: Secondary | ICD-10-CM | POA: Diagnosis not present

## 2019-12-03 DIAGNOSIS — Z1152 Encounter for screening for COVID-19: Secondary | ICD-10-CM

## 2019-12-03 LAB — HEPATIC FUNCTION PANEL
ALT: 22 U/L (ref 0–35)
AST: 18 U/L (ref 0–37)
Albumin: 4.4 g/dL (ref 3.5–5.2)
Alkaline Phosphatase: 62 U/L (ref 39–117)
Bilirubin, Direct: 0.1 mg/dL (ref 0.0–0.3)
Total Bilirubin: 0.8 mg/dL (ref 0.2–1.2)
Total Protein: 7 g/dL (ref 6.0–8.3)

## 2019-12-03 LAB — BASIC METABOLIC PANEL
BUN: 16 mg/dL (ref 6–23)
CO2: 27 mEq/L (ref 19–32)
Calcium: 9.3 mg/dL (ref 8.4–10.5)
Chloride: 103 mEq/L (ref 96–112)
Creatinine, Ser: 1.12 mg/dL (ref 0.40–1.20)
GFR: 49.14 mL/min — ABNORMAL LOW (ref >60.00)
Glucose, Bld: 99 mg/dL (ref 70–99)
Potassium: 3.6 mEq/L (ref 3.5–5.1)
Sodium: 142 mEq/L (ref 135–145)

## 2019-12-03 LAB — LIPID PANEL
Cholesterol: 158 mg/dL (ref 0–200)
HDL: 54.8 mg/dL (ref >39.00)
LDL Cholesterol: 80 mg/dL (ref 0–99)
NonHDL: 103.05
Total CHOL/HDL Ratio: 3
Triglycerides: 113 mg/dL (ref 0.0–149.0)
VLDL: 22.6 mg/dL (ref 0.0–40.0)

## 2019-12-03 LAB — HEMOGLOBIN A1C: Hgb A1c MFr Bld: 5.6 % (ref 4.6–6.5)

## 2019-12-03 LAB — TSH: TSH: 9.51 u[IU]/mL — ABNORMAL HIGH (ref 0.35–4.50)

## 2019-12-04 ENCOUNTER — Other Ambulatory Visit: Payer: Self-pay | Admitting: *Deleted

## 2019-12-04 ENCOUNTER — Other Ambulatory Visit: Payer: Self-pay | Admitting: Internal Medicine

## 2019-12-04 DIAGNOSIS — E039 Hypothyroidism, unspecified: Secondary | ICD-10-CM

## 2019-12-04 LAB — SARS COV-2 SEROLOGY(COVID-19)AB(IGG,IGM),IMMUNOASSAY
SARS CoV-2 AB IgG: NEGATIVE
SARS CoV-2 IgM: NEGATIVE

## 2019-12-04 MED ORDER — LEVOTHYROXINE SODIUM 50 MCG PO TABS
50.0000 ug | ORAL_TABLET | Freq: Every day | ORAL | 3 refills | Status: DC
Start: 2019-12-04 — End: 2020-10-01

## 2019-12-04 NOTE — Progress Notes (Signed)
Order placed for f/u tsh.  

## 2019-12-13 ENCOUNTER — Telehealth: Payer: Self-pay | Admitting: Internal Medicine

## 2019-12-13 NOTE — Telephone Encounter (Signed)
-----   Message from Lavonia Dana, MD sent at 12/01/2019  8:29 AM EDT ----- Regarding: RE: question No, She can get the vaccine.  ----- Message ----- From: Einar Pheasant, MD Sent: 11/30/2019   8:20 PM EDT To: Lavonia Dana, MD Subject: question                                       Ms Lococo contacted me with questions about the covid vaccine (given her allergy to infuenza vaccine and her CKD).   She had contacted your office as well.  She was questioning whether or not she should take the covid vaccine, secondary to her underlying kidney issues.  I informed her that I was not aware of any reason (based on her kidney issues) that would prevent her from getting the vaccine. She had wanted me to touch base with you to confirm.  Thank you for your help.    Einar Pheasant

## 2019-12-20 IMAGING — DX DG HIP (WITH OR WITHOUT PELVIS) 2-3V*R*
3 series · 3 of 3 positions shown · non-contrast
Comparison: 09/27/2013 CT.

CLINICAL DATA: 62-year-old female with abdominal pain extending to
right hip. Initial encounter.

EXAM:
DG HIP (WITH OR WITHOUT PELVIS) 2-3V RIGHT

[pelvis ap]
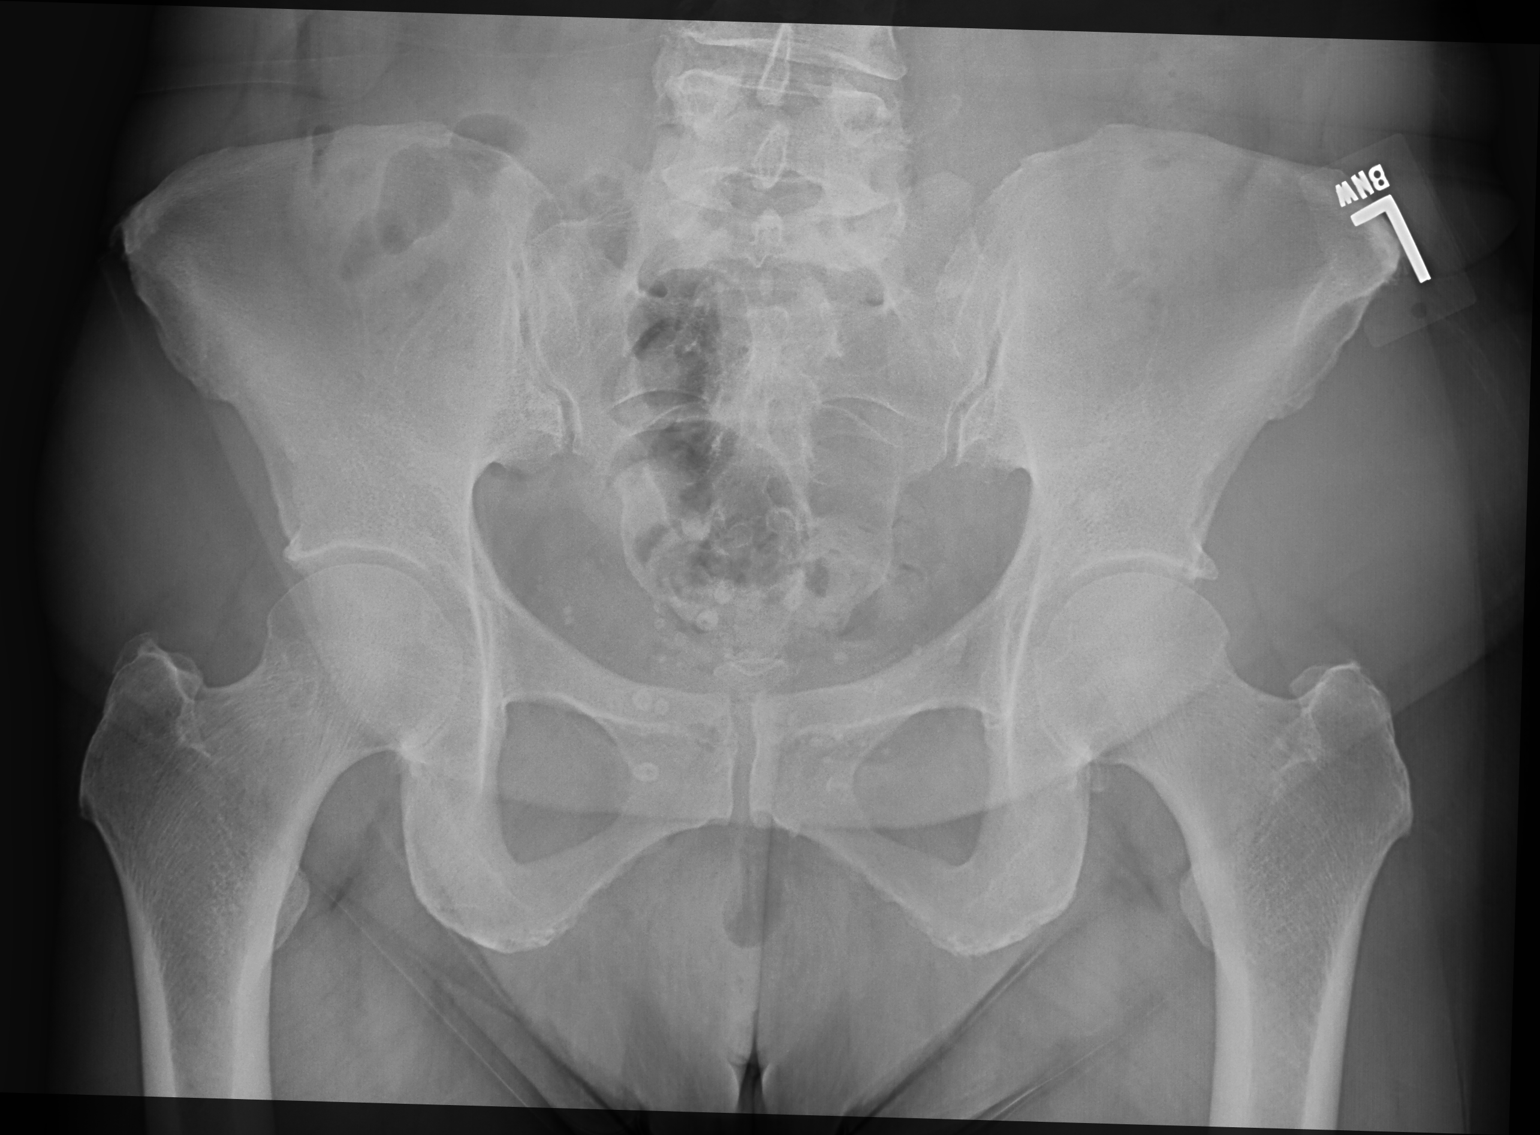

[hip joint ap]
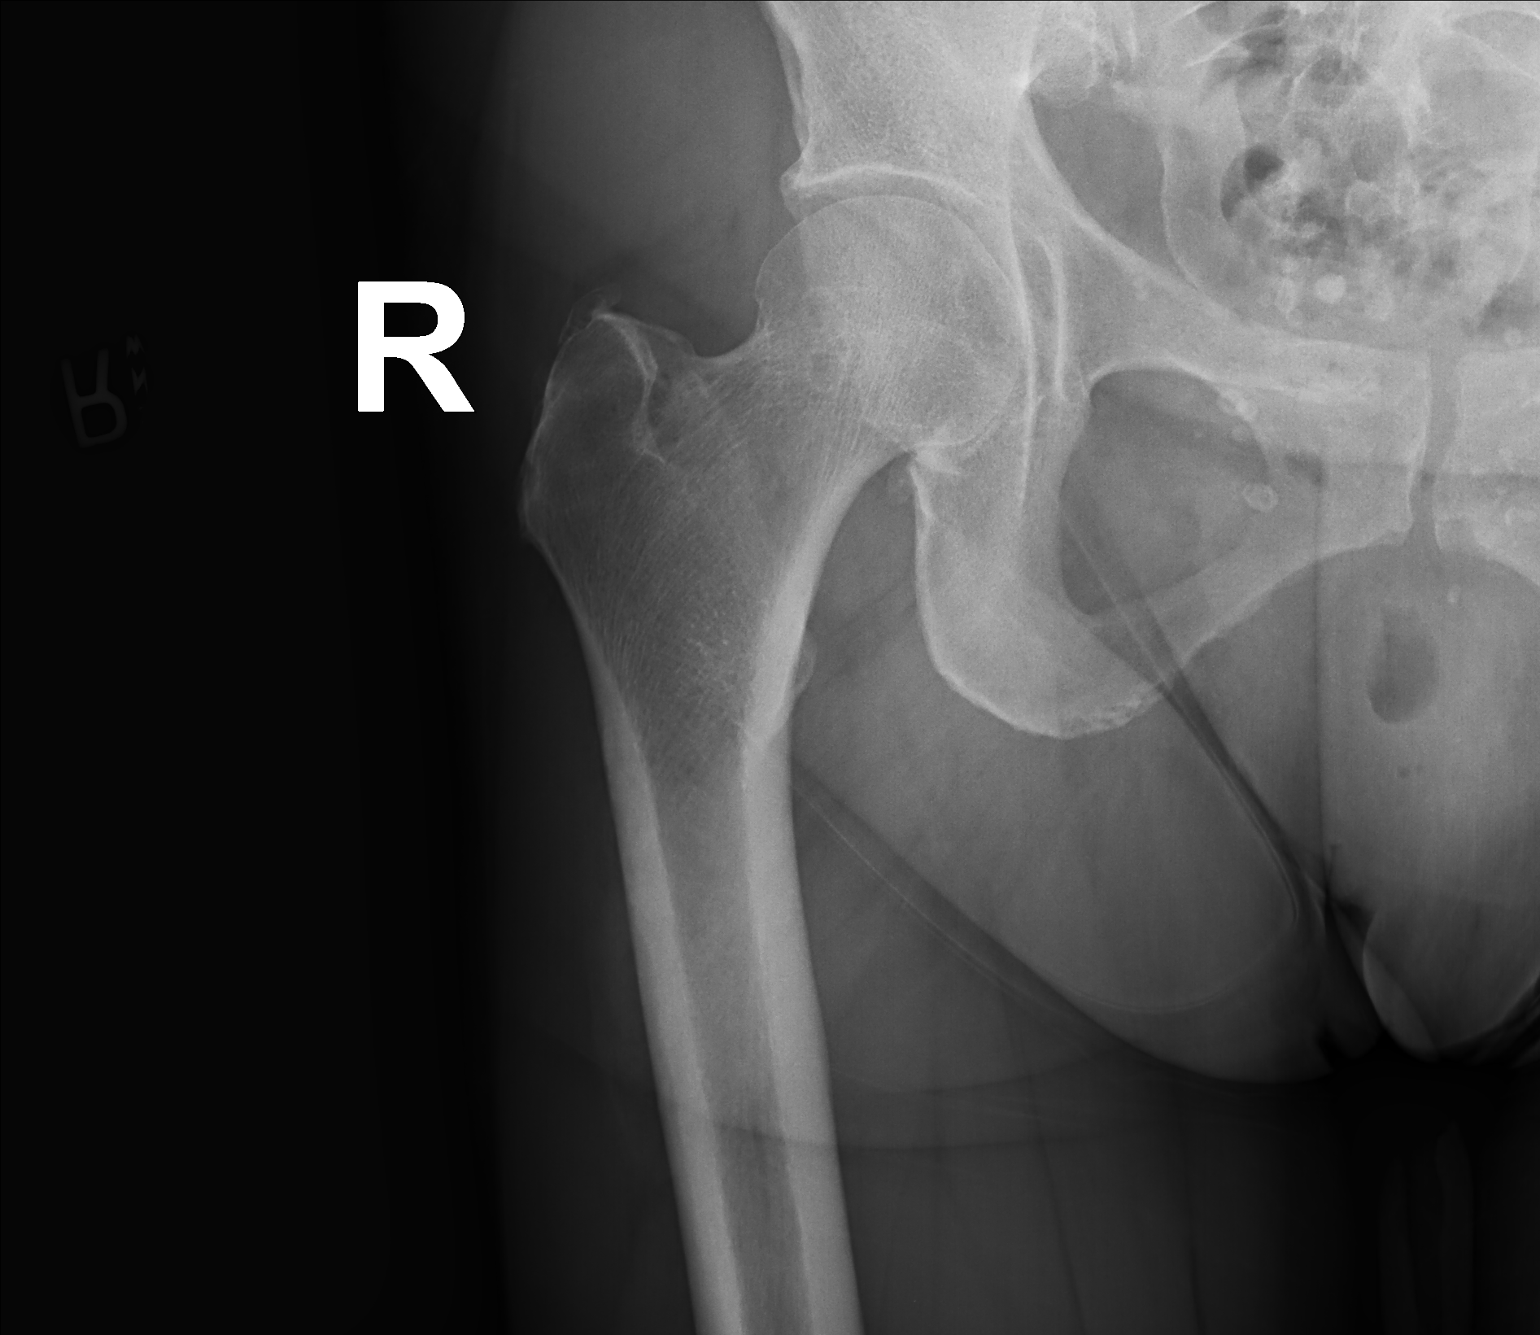

[ap frog obl (oblique)]
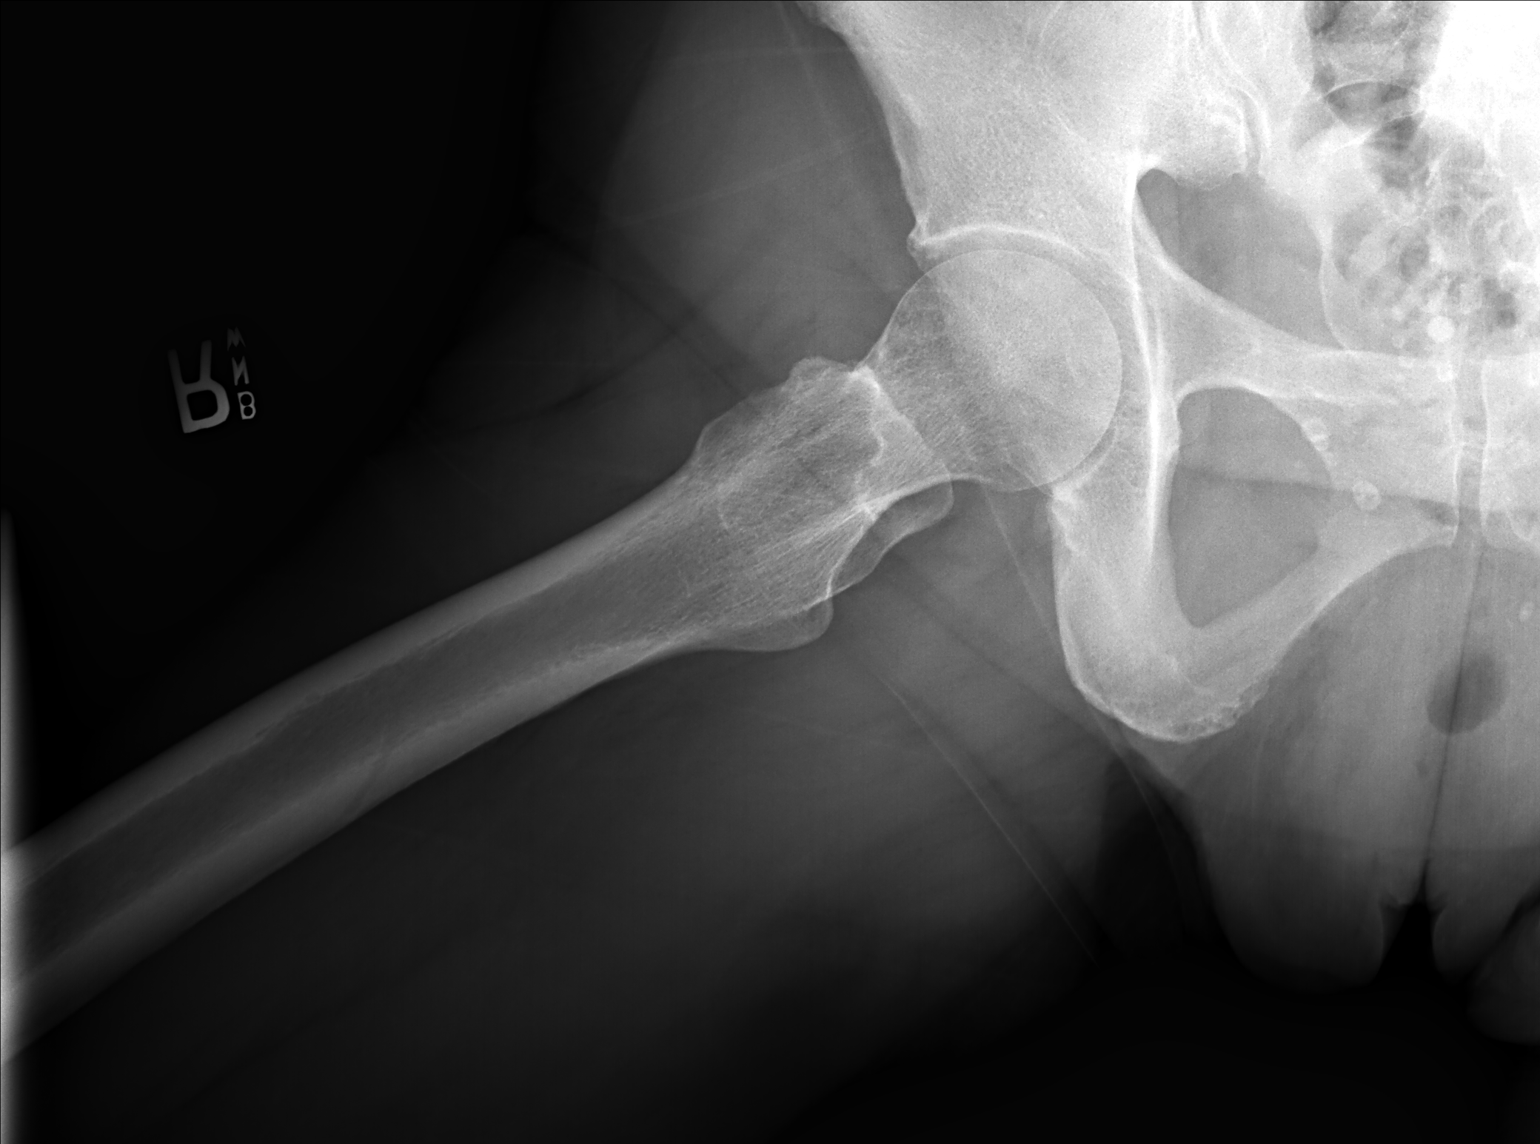

[3 of 3 positions shown; findings below may reference images not displayed]

FINDINGS: Very mild bilateral hip joint degenerative changes. Small
subchondral cysts right femoral head-neck region may be related to
impingement. No plain film evidence of right femoral head avascular
necrosis. No right hip fracture or dislocation. Degenerative changes
lower lumbar spine.
IMPRESSION: Very mild bilateral hip joint degenerative changes. Small
subchondral cysts right femoral head-neck region may be related to
impingement.

## 2019-12-26 ENCOUNTER — Telehealth: Payer: Self-pay | Admitting: *Deleted

## 2019-12-26 ENCOUNTER — Telehealth: Payer: Self-pay | Admitting: Internal Medicine

## 2019-12-26 NOTE — Telephone Encounter (Signed)
See other message already sent to Dr Nicki Reaper

## 2019-12-26 NOTE — Telephone Encounter (Signed)
Pt is scheduled to get her first Covid vaccine tomorrow and wanted to know if she should still get it since she came in contact with someone who tested positive over the weekend with Covid

## 2019-12-26 NOTE — Telephone Encounter (Signed)
If has been exposed (had direct exposure), I would recommend being tested before getting the vaccine.  Will need to quarantine.

## 2019-12-26 NOTE — Telephone Encounter (Signed)
FYI

## 2019-12-26 NOTE — Telephone Encounter (Signed)
Pt states scheduled for covid vaccine tomorrow, was exposed to covid positive family member Sun and Mon. Questioning if she should be tested prior to receiving vaccine. Advised testing 5-7 days post exposure, reviewed quarantine precautions. Pt states Dr.Scott was concerned about possible reaction to vaccine and "Did some research, she really wants me to take it." Advised to call PCP for review, pt verbalizes understanding.  Pt called on community line.

## 2019-12-26 NOTE — Telephone Encounter (Signed)
Pt aware.

## 2019-12-27 ENCOUNTER — Ambulatory Visit: Payer: BC Managed Care – PPO

## 2019-12-31 ENCOUNTER — Ambulatory Visit: Payer: BC Managed Care – PPO | Attending: Internal Medicine

## 2019-12-31 DIAGNOSIS — Z23 Encounter for immunization: Secondary | ICD-10-CM

## 2019-12-31 NOTE — Progress Notes (Signed)
   Covid-19 Vaccination Clinic  Name:  Judith Rios    MRN: [id number] DOB: [birth year]  12/31/2019  Judith Rios was observed post Covid-19 immunization for 30 minutes based on pre-vaccination screening .  During the observation period, she experienced an adverse reaction with the following symptoms:  dizziness.  Assessment : Time of assessment. Clammy.  Actions taken:  Vitals sign taken   There were no vitals filed for this visit.  Medications administered: No medication administered.  Disposition: Reports no further symptoms of adverse reaction after observation for 45 minutes. Discharged home.   Immunizations Administered    Name Date Dose VIS Date Route   Pfizer COVID-19 Vaccine 12/31/2019 11:48 AM 0.3 mL 06/19/2018 Intramuscular   Manufacturer: [place]   Lot: [id number]   [place]: [address]-7

## 2020-01-15 ENCOUNTER — Telehealth: Payer: Self-pay | Admitting: *Deleted

## 2020-01-15 NOTE — Telephone Encounter (Signed)
Called and spoke with the patient in regards to her first COVID vaccine that she received. She states that she felt "drugged" and experienced dizziness until the following day. She is scheduled to get her next COVID vaccine next week. She has had a sever reaction to the flu vaccine and has chronic kidney disease. Please advise.

## 2020-01-16 ENCOUNTER — Other Ambulatory Visit: Payer: Self-pay

## 2020-01-16 ENCOUNTER — Other Ambulatory Visit (INDEPENDENT_AMBULATORY_CARE_PROVIDER_SITE_OTHER): Payer: BC Managed Care – PPO

## 2020-01-16 DIAGNOSIS — E039 Hypothyroidism, unspecified: Secondary | ICD-10-CM

## 2020-01-16 LAB — TSH: TSH: 9.36 u[IU]/mL — ABNORMAL HIGH (ref 0.35–4.50)

## 2020-01-16 NOTE — Telephone Encounter (Signed)
Judith Rios is part of the COVID reactions in [place] and is not a current patient of ours. Would she need to be seen as a new patient first before coming in on a [place] clinic day?

## 2020-01-16 NOTE — Telephone Encounter (Signed)
Called and advised plan to patient. Patient verbalized understanding and already has her second appointment scheduled for next week to get her second COVID vaccine.

## 2020-01-16 NOTE — Telephone Encounter (Signed)
No they do not need to be an established patient to come in for a vaccine. I confirmed with Johnnette.   Salvatore Marvel, MD [facility] [place]

## 2020-01-16 NOTE — Telephone Encounter (Signed)
I think she can go ahead and get the second injection.  She could premedicate with Zyrtec 10 mg around 15 minutes before.  We do have a vaccine clinic coming up in [place] on September 29 if she would like to get it there in our office.  Salvatore Marvel, MD [facility] [place] [facility] [place]

## 2020-01-18 ENCOUNTER — Other Ambulatory Visit: Payer: Self-pay | Admitting: Internal Medicine

## 2020-01-18 DIAGNOSIS — E039 Hypothyroidism, unspecified: Secondary | ICD-10-CM

## 2020-01-18 NOTE — Progress Notes (Signed)
Order placed for f/u labs.  

## 2020-01-21 ENCOUNTER — Ambulatory Visit: Payer: BC Managed Care – PPO | Attending: Internal Medicine

## 2020-01-21 DIAGNOSIS — Z23 Encounter for immunization: Secondary | ICD-10-CM

## 2020-01-21 NOTE — Progress Notes (Signed)
   Covid-19 Vaccination Clinic  Name:  Judith Rios    MRN: [id number] DOB: 1956/03/10  01/21/2020  [name] was observed post Covid-19 immunization for 30 minutes based on pre-vaccination screening without incident. She was provided with Vaccine Information Sheet and instruction to access the V-Safe system.   [name] was instructed to call 911 with any severe reactions post vaccine: Marland Kitchen Difficulty breathing  . Swelling of face and throat  . A fast heartbeat  . A bad rash all over body  . Dizziness and weakness   Immunizations Administered    Name Date Dose VIS Date Route   Pfizer COVID-19 Vaccine 01/21/2020  1:45 PM 0.3 mL 06/19/2018 Intramuscular   Manufacturer: [place]   Lot: [id number]   [place]: [id number]      Covid-19 Vaccination Clinic  Name:  Judith Rios    MRN: [id number] DOB: 1956-03-13  01/21/2020  [name] was observed post Covid-19 immunization for 30 minutes based on pre-vaccination screening without incident. She was provided with Vaccine Information Sheet and instruction to access the V-Safe system.   [name] was instructed to call 911 with any severe reactions post vaccine: Marland Kitchen Difficulty breathing  . Swelling of face and throat  . A fast heartbeat  . A bad rash all over body  . Dizziness and weakness   Immunizations Administered    Name Date Dose VIS Date Route   Pfizer COVID-19 Vaccine 01/21/2020  1:45 PM 0.3 mL 06/19/2018 Intramuscular   Manufacturer: [place]   Lot: [id number]   [place]: [address]-4

## 2020-02-11 ENCOUNTER — Encounter: Payer: Self-pay | Admitting: Internal Medicine

## 2020-02-11 ENCOUNTER — Other Ambulatory Visit: Payer: Self-pay

## 2020-02-13 ENCOUNTER — Other Ambulatory Visit: Payer: Self-pay | Admitting: Internal Medicine

## 2020-02-13 NOTE — Telephone Encounter (Signed)
Last refilled 08/2019 Last OV 11/2019  Wants sent to [facility].

## 2020-02-14 MED ORDER — ATORVASTATIN CALCIUM 20 MG PO TABS
20.0000 mg | ORAL_TABLET | Freq: Every day | ORAL | 0 refills | Status: DC
Start: 2020-02-14 — End: 2020-10-01

## 2020-02-14 MED ORDER — CILIDINIUM-CHLORDIAZEPOXIDE 2.5-5 MG PO CAPS: ORAL_CAPSULE | ORAL | 0 refills | Status: DC

## 2020-02-14 MED ORDER — CELECOXIB 200 MG PO CAPS
200.0000 mg | ORAL_CAPSULE | Freq: Every day | ORAL | 0 refills | Status: DC
Start: 2020-02-14 — End: 2020-06-25

## 2020-02-14 NOTE — Telephone Encounter (Signed)
rx ok's for librium.

## 2020-04-15 ENCOUNTER — Other Ambulatory Visit: Payer: Self-pay | Admitting: Nephrology

## 2020-04-15 ENCOUNTER — Ambulatory Visit
Admission: RE | Admit: 2020-04-15 | Discharge: 2020-04-15 | Disposition: A | Discharge status: Home / Self Care | Payer: BC Managed Care – PPO | Source: Ambulatory Visit | Attending: Urology | Admitting: Urology

## 2020-04-15 ENCOUNTER — Other Ambulatory Visit: Payer: Self-pay

## 2020-04-15 ENCOUNTER — Ambulatory Visit
Admission: RE | Admit: 2020-04-15 | Discharge: 2020-04-15 | Disposition: A | Discharge status: Home / Self Care | Payer: BC Managed Care – PPO | Attending: Urology | Admitting: Urology

## 2020-04-15 ENCOUNTER — Other Ambulatory Visit (HOSPITAL_COMMUNITY): Payer: Self-pay | Admitting: Nephrology

## 2020-04-15 DIAGNOSIS — I878 Other specified disorders of veins: Secondary | ICD-10-CM | POA: Diagnosis not present

## 2020-04-15 DIAGNOSIS — I10 Essential (primary) hypertension: Secondary | ICD-10-CM

## 2020-04-15 DIAGNOSIS — N281 Cyst of kidney, acquired: Secondary | ICD-10-CM | POA: Diagnosis not present

## 2020-04-15 DIAGNOSIS — N183 Chronic kidney disease, stage 3 unspecified: Secondary | ICD-10-CM

## 2020-04-16 ENCOUNTER — Ambulatory Visit
Admission: RE | Admit: 2020-04-16 | Discharge: 2020-04-16 | Disposition: A | Discharge status: Home / Self Care | Payer: BC Managed Care – PPO | Source: Ambulatory Visit | Attending: Nephrology | Admitting: Nephrology

## 2020-04-16 DIAGNOSIS — I10 Essential (primary) hypertension: Secondary | ICD-10-CM

## 2020-04-16 DIAGNOSIS — N183 Chronic kidney disease, stage 3 unspecified: Secondary | ICD-10-CM | POA: Diagnosis not present

## 2020-04-16 DIAGNOSIS — N281 Cyst of kidney, acquired: Secondary | ICD-10-CM | POA: Diagnosis not present

## 2020-04-21 ENCOUNTER — Telehealth: Payer: Self-pay | Admitting: *Deleted

## 2020-04-21 NOTE — Telephone Encounter (Signed)
Patient informed, voiced understanding.  °

## 2020-04-21 NOTE — Telephone Encounter (Signed)
-----   Message from Riki Altes, MD sent at 04/20/2020  5:41 PM EST ----- KUB showed no evidence of recurrent stones. The renal ultrasound ordered by Dr. Wynelle Link showed a stable renal cyst

## 2020-04-22 DIAGNOSIS — Z87442 Personal history of urinary calculi: Secondary | ICD-10-CM | POA: Diagnosis not present

## 2020-04-22 DIAGNOSIS — R8290 Unspecified abnormal findings in urine: Secondary | ICD-10-CM | POA: Diagnosis not present

## 2020-04-22 DIAGNOSIS — N390 Urinary tract infection, site not specified: Secondary | ICD-10-CM | POA: Diagnosis not present

## 2020-04-22 DIAGNOSIS — E039 Hypothyroidism, unspecified: Secondary | ICD-10-CM | POA: Diagnosis not present

## 2020-04-22 DIAGNOSIS — N281 Cyst of kidney, acquired: Secondary | ICD-10-CM | POA: Diagnosis not present

## 2020-04-22 DIAGNOSIS — I10 Essential (primary) hypertension: Secondary | ICD-10-CM | POA: Diagnosis not present

## 2020-04-22 DIAGNOSIS — N183 Chronic kidney disease, stage 3 unspecified: Secondary | ICD-10-CM | POA: Diagnosis not present

## 2020-04-27 ENCOUNTER — Other Ambulatory Visit: Payer: Self-pay | Admitting: Student

## 2020-04-27 DIAGNOSIS — N200 Calculus of kidney: Secondary | ICD-10-CM

## 2020-06-25 ENCOUNTER — Other Ambulatory Visit: Payer: Self-pay | Admitting: Internal Medicine

## 2020-06-25 ENCOUNTER — Other Ambulatory Visit: Payer: Self-pay | Admitting: Urology

## 2020-06-25 DIAGNOSIS — R339 Retention of urine, unspecified: Secondary | ICD-10-CM

## 2020-06-26 ENCOUNTER — Telehealth: Payer: Self-pay | Admitting: *Deleted

## 2020-06-26 NOTE — Telephone Encounter (Signed)
Patient called regarding refills on Tamsulosin and Hydrochlorothiazide being denied-please advise

## 2020-06-26 NOTE — Telephone Encounter (Signed)
Patient needs medication refill appt,

## 2020-06-29 ENCOUNTER — Other Ambulatory Visit: Payer: Self-pay | Admitting: Internal Medicine

## 2020-06-29 MED ORDER — CILIDINIUM-CHLORDIAZEPOXIDE 2.5-5 MG PO CAPS: ORAL_CAPSULE | ORAL | 0 refills | Status: DC

## 2020-06-29 NOTE — Telephone Encounter (Signed)
rx sent in for librium #[id number] with no refills.

## 2020-07-02 ENCOUNTER — Ambulatory Visit: Payer: Self-pay | Admitting: Urology

## 2020-07-27 ENCOUNTER — Telehealth: Payer: Self-pay | Admitting: Internal Medicine

## 2020-07-27 DIAGNOSIS — N183 Chronic kidney disease, stage 3 unspecified: Secondary | ICD-10-CM

## 2020-07-27 DIAGNOSIS — R7989 Other specified abnormal findings of blood chemistry: Secondary | ICD-10-CM

## 2020-07-27 DIAGNOSIS — E7800 Pure hypercholesterolemia, unspecified: Secondary | ICD-10-CM

## 2020-07-27 DIAGNOSIS — R739 Hyperglycemia, unspecified: Secondary | ICD-10-CM

## 2020-07-27 NOTE — Telephone Encounter (Signed)
Patient's wants labs done prior to 6/2 on her physical

## 2020-07-29 ENCOUNTER — Encounter: Payer: BC Managed Care – PPO | Admitting: Internal Medicine

## 2020-07-29 NOTE — Telephone Encounter (Signed)
Scheduled pt

## 2020-07-29 NOTE — Telephone Encounter (Signed)
Labs ordered. Please schedule fasting lab prior to appt on 6/2.

## 2020-09-17 ENCOUNTER — Encounter: Payer: Self-pay | Admitting: Internal Medicine

## 2020-09-17 ENCOUNTER — Other Ambulatory Visit: Payer: Self-pay

## 2020-09-17 ENCOUNTER — Other Ambulatory Visit: Payer: BC Managed Care – PPO

## 2020-09-17 ENCOUNTER — Telehealth (INDEPENDENT_AMBULATORY_CARE_PROVIDER_SITE_OTHER): Payer: BC Managed Care – PPO | Admitting: Internal Medicine

## 2020-09-17 VITALS — Ht 60.0 in | Wt 148.0 lb

## 2020-09-17 DIAGNOSIS — Z87442 Personal history of urinary calculi: Secondary | ICD-10-CM

## 2020-09-17 DIAGNOSIS — R739 Hyperglycemia, unspecified: Secondary | ICD-10-CM | POA: Diagnosis not present

## 2020-09-17 DIAGNOSIS — E039 Hypothyroidism, unspecified: Secondary | ICD-10-CM

## 2020-09-17 DIAGNOSIS — R1112 Projectile vomiting: Secondary | ICD-10-CM

## 2020-09-17 DIAGNOSIS — E785 Hyperlipidemia, unspecified: Secondary | ICD-10-CM

## 2020-09-17 DIAGNOSIS — Z87898 Personal history of other specified conditions: Secondary | ICD-10-CM

## 2020-09-17 DIAGNOSIS — R1114 Bilious vomiting: Secondary | ICD-10-CM

## 2020-09-17 DIAGNOSIS — M791 Myalgia, unspecified site: Secondary | ICD-10-CM | POA: Diagnosis not present

## 2020-09-17 DIAGNOSIS — R946 Abnormal results of thyroid function studies: Secondary | ICD-10-CM

## 2020-09-17 MED ORDER — PROMETHAZINE HCL 25 MG RE SUPP: 25.0000 mg | Freq: Three times a day (TID) | RECTAL | 0 refills | Status: DC | PRN

## 2020-09-17 NOTE — Progress Notes (Signed)
Patient presenting with vomiting since 12:30 pm yesterday.  No changes in diet, Patient's husband had a stomach virus a few weeks ago.  Tested negative for COVID yesterday and it was negative.   2 weeks ago Patient thought her IBS or diverticulitis was flared up. This would go away off and on.

## 2020-09-17 NOTE — Patient Instructions (Signed)
Nausea, Adult Nausea is the feeling that you have an upset stomach or that you are about to vomit. Nausea on its own is not usually a serious concern, but it may be an early sign of a more serious medical problem. As nausea gets worse, it can lead to vomiting. If vomiting develops, or if you are not able to drink enough fluids, you are at risk of becoming dehydrated. Dehydration can make you tired and thirsty, cause you to have a dry mouth, and decrease how often you urinate. Older adults and people with other diseases or a weak disease-fighting system (immune system) are at higher risk for dehydration. The main goals of treating your nausea are:  To relieve your nausea.  To limit repeated nausea episodes.  To prevent vomiting and dehydration. Follow these instructions at home: Watch your symptoms for any changes. Tell your health care provider about them. Follow these instructions as told by your health care provider. Eating and drinking  Take an oral rehydration solution (ORS). This is a drink that is sold at pharmacies and retail stores.  Drink clear fluids slowly and in small amounts as you are able. Clear fluids include water, ice chips, low-calorie sports drinks, and fruit juice that has water added (diluted fruit juice).  Eat bland, easy-to-digest foods in small amounts as you are able. These foods include bananas, applesauce, rice, lean meats, toast, and crackers.  Avoid drinking fluids that contain a lot of sugar or caffeine, such as energy drinks, sports drinks, and soda.  Avoid alcohol.  Avoid spicy or fatty foods.      General instructions  Take over-the-counter and prescription medicines only as told by your health care provider.  Rest at home while you recover.  Drink enough fluid to keep your urine pale yellow.  Breathe slowly and deeply when you feel nauseous.  Avoid smelling things that have strong odors.  Wash your hands often using soap and water. If soap and  water are not available, use hand sanitizer.  Make sure that all people in your household wash their hands well and often.  Keep all follow-up visits as told by your health care provider. This is important. Contact a health care provider if:  Your nausea gets worse.  Your nausea does not go away after two days.  You vomit.  You cannot drink fluids without vomiting.  You have any of the following: ? New symptoms. ? A fever. ? A headache. ? Muscle cramps. ? A rash. ? Pain while urinating.  You feel light-headed or dizzy. Get help right away if:  You have pain in your chest, neck, arm, or jaw.  You feel extremely weak or you faint.  You have vomit that is bright red or looks like coffee grounds.  You have bloody or black stools or stools that look like tar.  You have a severe headache, a stiff neck, or both.  You have severe pain, cramping, or bloating in your abdomen.  You have difficulty breathing or are breathing very quickly.  Your heart is beating very quickly.  Your skin feels cold and clammy.  You feel confused.  You have signs of dehydration, such as: ? Dark urine, very little urine, or no urine. ? Cracked lips. ? Dry mouth. ? Sunken eyes. ? Sleepiness. ? Weakness. These symptoms may represent a serious problem that is an emergency. Do not wait to see if the symptoms will go away. Get medical help right away. Call your local emergency services (  911 in the [place] Do not drive yourself to the hospital. Summary  Nausea is the feeling that you have an upset stomach or that you are about to vomit. Nausea on its own is not usually a serious concern, but it may be an early sign of a more serious medical problem.  If vomiting develops, or if you are not able to drink enough fluids, you are at risk of becoming dehydrated.  Follow recommendations for eating and drinking and take over-the-counter and prescription medicines only as told by your health care  provider.  Contact a health care provider right away if your symptoms worsen or you have new symptoms.  Keep all follow-up visits as told by your health care provider. This is important. This information is not intended to replace advice given to you by your health care provider. Make sure you discuss any questions you have with your health care provider. Document Revised: 03/12/2019 Document Reviewed: 09/19/2017 Elsevier Patient Education  2021 [place] Diet A bland diet consists of foods that are often soft and do not have a lot of fat, fiber, or extra seasonings. Foods without fat, fiber, or seasoning are easier for the body to digest. They are also less likely to irritate your mouth, throat, stomach, and other parts of your digestive system. A bland diet is sometimes called a BRAT diet. What is my plan? Your health care provider or food and nutrition specialist (dietitian) may recommend specific changes to your diet to prevent symptoms or to treat your symptoms. These changes may include:  Eating small meals often.  Cooking food until it is soft enough to chew easily.  Chewing your food well.  Drinking fluids slowly.  Not eating foods that are very spicy, sour, or fatty.  Not eating citrus fruits, such as oranges and grapefruit. What do I need to know about this diet?  Eat a variety of foods from the bland diet food list.  Do not follow a bland diet longer than needed.  Ask your health care provider whether you should take vitamins or supplements. What foods can I eat? Grains Hot cereals, such as cream of wheat. Rice. Bread, crackers, or tortillas made from refined white flour.   Vegetables Canned or cooked vegetables. Mashed or boiled potatoes. Fruits Bananas. Applesauce. Other types of cooked or canned fruit with the skin and seeds removed, such as canned peaches or pears.   Meats and other proteins Scrambled eggs. Creamy peanut butter or other nut butters.  Lean, well-cooked meats, such as chicken or fish. Tofu. Soups or broths.   Dairy Low-fat dairy products, such as milk, cottage cheese, or yogurt. Beverages Water. Herbal tea. Apple juice.   Fats and oils Mild salad dressings. [place] or olive oil. Sweets and desserts Pudding. Custard. Fruit gelatin. Ice cream. The items listed above may not be a complete list of recommended foods and beverages. Contact a dietitian for more options. What foods are not recommended? Grains Whole grain breads and cereals. Vegetables Raw vegetables. Fruits Raw fruits, especially citrus, berries, or dried fruits. Dairy Whole fat dairy foods. Beverages Caffeinated drinks. Alcohol. Seasonings and condiments Strongly flavored seasonings or condiments. Hot sauce. Salsa. Other foods Spicy foods. Fried foods. Sour foods, such as pickled or fermented foods. Foods with high sugar content. Foods high in fiber. The items listed above may not be a complete list of foods and beverages to avoid. Contact a dietitian for more information. Summary  A bland diet consists of foods that  are often soft and do not have a lot of fat, fiber, or extra seasonings.  Foods without fat, fiber, or seasoning are easier for the body to digest.  Check with your health care provider to see how long you should follow this diet plan. It is not meant to be followed for long periods. This information is not intended to replace advice given to you by your health care provider. Make sure you discuss any questions you have with your health care provider. Document Revised: 05/10/2017 Document Reviewed: 05/10/2017 Elsevier Patient Education  2021 [employer].
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
## 2020-09-18 ENCOUNTER — Telehealth: Payer: Self-pay

## 2020-09-18 LAB — CBC WITH DIFFERENTIAL/PLATELET
Basophils Absolute: 0 10*3/uL (ref 0.0–0.2)
Basos: 0 %
EOS (ABSOLUTE): 0 10*3/uL (ref 0.0–0.4)
Eos: 0 %
Hematocrit: 45.5 % (ref 34.0–46.6)
Hemoglobin: 15.6 g/dL (ref 11.1–15.9)
Immature Grans (Abs): 0 10*3/uL (ref 0.0–0.1)
Immature Granulocytes: 0 %
Lymphocytes Absolute: 0.9 10*3/uL (ref 0.7–3.1)
Lymphs: 17 %
MCH: 29.8 pg (ref 26.6–33.0)
MCHC: 34.3 g/dL (ref 31.5–35.7)
MCV: 87 fL (ref 79–97)
Monocytes Absolute: 0.4 10*3/uL (ref 0.1–0.9)
Monocytes: 7 %
Neutrophils Absolute: 4.1 10*3/uL (ref 1.4–7.0)
Neutrophils: 76 %
Platelets: 221 10*3/uL (ref 150–450)
RBC: 5.23 x10E6/uL (ref 3.77–5.28)
RDW: 13.1 % (ref 11.7–15.4)
WBC: 5.4 10*3/uL (ref 3.4–10.8)

## 2020-09-18 LAB — COMPREHENSIVE METABOLIC PANEL
ALT: 29 IU/L (ref 0–32)
AST: 24 IU/L (ref 0–40)
Albumin/Globulin Ratio: 1.5 (ref 1.2–2.2)
Albumin: 4.2 g/dL (ref 3.8–4.8)
Alkaline Phosphatase: 65 IU/L (ref 44–121)
BUN/Creatinine Ratio: 15 (ref 12–28)
BUN: 18 mg/dL (ref 8–27)
Bilirubin Total: 1.1 mg/dL (ref 0.0–1.2)
CO2: 24 mmol/L (ref 20–29)
Calcium: 9 mg/dL (ref 8.7–10.3)
Chloride: 100 mmol/L (ref 96–106)
Creatinine, Ser: 1.22 mg/dL — ABNORMAL HIGH (ref 0.57–1.00)
Globulin, Total: 2.8 g/dL (ref 1.5–4.5)
Glucose: 97 mg/dL (ref 65–99)
Potassium: 3.4 mmol/L — ABNORMAL LOW (ref 3.5–5.2)
Sodium: 140 mmol/L (ref 134–144)
Total Protein: 7 g/dL (ref 6.0–8.5)
eGFR: 50 mL/min/{1.73_m2} — ABNORMAL LOW (ref >59)

## 2020-09-18 LAB — URINALYSIS, ROUTINE W REFLEX MICROSCOPIC
Bilirubin, UA: NEGATIVE
Glucose, UA: NEGATIVE
Ketones, UA: NEGATIVE
Nitrite, UA: NEGATIVE
RBC, UA: NEGATIVE
Specific Gravity, UA: 1.025 (ref 1.005–1.030)
Urobilinogen, Ur: 1 mg/dL (ref 0.2–1.0)
pH, UA: 6.5 (ref 5.0–7.5)

## 2020-09-18 LAB — LIPID PANEL
Chol/HDL Ratio: 4.2 ratio (ref 0.0–4.4)
Cholesterol, Total: 220 mg/dL — ABNORMAL HIGH (ref 100–199)
HDL: 52 mg/dL (ref >39)
LDL Chol Calc (NIH): 146 mg/dL — ABNORMAL HIGH (ref 0–99)
Triglycerides: 124 mg/dL (ref 0–149)
VLDL Cholesterol Cal: 22 mg/dL (ref 5–40)

## 2020-09-18 LAB — HEMOGLOBIN A1C
Est. average glucose Bld gHb Est-mCnc: 126 mg/dL
Hgb A1c MFr Bld: 6 % — ABNORMAL HIGH (ref 4.8–5.6)

## 2020-09-18 LAB — THYROID PEROXIDASE ANTIBODY: Thyroperoxidase Ab SerPl-aCnc: <8 IU/mL (ref 0–34)

## 2020-09-18 LAB — MICROSCOPIC EXAMINATION
Bacteria, UA: NONE SEEN
Casts: NONE SEEN /lpf
Epithelial Cells (non renal): >10 /hpf — AB (ref 0–10)
RBC, Urine: NONE SEEN /hpf (ref 0–2)

## 2020-09-18 LAB — TSH: TSH: 1.51 u[IU]/mL (ref 0.450–4.500)

## 2020-09-18 LAB — T4, FREE: Free T4: 0.89 ng/dL (ref 0.82–1.77)

## 2020-09-18 LAB — T3, FREE: T3, Free: 1.8 pg/mL — ABNORMAL LOW (ref 2.0–4.4)

## 2020-09-18 NOTE — Telephone Encounter (Signed)
LVM to return in about 4 weeks (around 10/15/2020).

## 2020-09-19 LAB — URINE CULTURE

## 2020-09-23 ENCOUNTER — Telehealth: Payer: Self-pay | Admitting: Internal Medicine

## 2020-09-23 NOTE — Telephone Encounter (Signed)
Patient called and she thinks she has shingles on her feet. No appointments available for today. Patient was transferred to [facility].

## 2020-09-24 ENCOUNTER — Ambulatory Visit: Payer: BC Managed Care – PPO

## 2020-09-24 ENCOUNTER — Telehealth: Payer: Self-pay

## 2020-09-24 ENCOUNTER — Encounter: Payer: BC Managed Care – PPO | Admitting: Internal Medicine

## 2020-09-24 NOTE — Telephone Encounter (Signed)
Patient called over new rash on inside of foot. Patient was instructed to go to an Urgent care and to see PCP within 24 hours. Floella cancelled the appointment scheduled for today and has a new appointment scheduled for 10/16/2019. No urgent care records are visible through Epic.

## 2020-09-24 NOTE — Telephone Encounter (Signed)
Patient has been scheduled with Dr Olivia Mackie

## 2020-09-24 NOTE — Telephone Encounter (Signed)
Patient called the office at 6:47 pm to schedule an appointment.

## 2020-09-25 ENCOUNTER — Encounter: Payer: Self-pay | Admitting: Internal Medicine

## 2020-09-25 ENCOUNTER — Telehealth (INDEPENDENT_AMBULATORY_CARE_PROVIDER_SITE_OTHER): Payer: BC Managed Care – PPO | Admitting: Internal Medicine

## 2020-09-25 VITALS — BP 116/76 | Ht 60.0 in | Wt 144.0 lb

## 2020-09-25 DIAGNOSIS — B029 Zoster without complications: Secondary | ICD-10-CM

## 2020-09-25 MED ORDER — TRAMADOL HCL 50 MG PO TABS: 50.0000 mg | ORAL_TABLET | Freq: Two times a day (BID) | ORAL | 0 refills | Status: AC | PRN

## 2020-09-25 MED ORDER — VALACYCLOVIR HCL 1 G PO TABS: 1000.0000 mg | ORAL_TABLET | Freq: Three times a day (TID) | ORAL | 0 refills | Status: DC

## 2020-09-25 NOTE — Progress Notes (Signed)
Right foot rash, pain, tingling starting Tuesday. Patient was having shooting pain from the toes into the right foot on and off randomly. Then rash appeared on the top of the right foot. Blistered with a scab. Yesterday another rash appeared on the right ankle. Pain rated 7/10.

## 2020-09-25 NOTE — Patient Instructions (Addendum)
Valacyclovir  What is this medicine? VALACYCLOVIR (val ay SYE kloe veer) is an antiviral medicine. It is used to treat or prevent infections caused by certain kinds of viruses. Examples of these infections include herpes and shingles. This medicine will not cure herpes. This medicine may be used for other purposes; ask your health care provider or pharmacist if you have questions. COMMON BRAND NAME(S): Valtrex What should I tell my health care provider before I take this medicine? They need to know if you have any of these conditions:  acquired immunodeficiency syndrome (AIDS)  any other condition that may weaken the immune system  bone marrow or kidney transplant  kidney disease  an unusual or allergic reaction to valacyclovir, acyclovir, ganciclovir, valganciclovir, other medicines, foods, dyes, or preservatives  pregnant or trying to get pregnant  breast-feeding How should I use this medicine? Take this medicine by mouth with a glass of water. Follow the directions on the prescription label. You can take this medicine with or without food. Take your doses at regular intervals. Do not take your medicine more often than directed. Finish the full course prescribed by your doctor or health care professional even if you think your condition is better. Do not stop taking except on the advice of your doctor or health care professional. Talk to your pediatrician regarding the use of this medicine in children. While this drug may be prescribed for children as young as 2 years for selected conditions, precautions do apply. Overdosage: If you think you have taken too much of this medicine contact a poison control center or emergency room at once. NOTE: This medicine is only for you. Do not share this medicine with others. What if I miss a dose? If you miss a dose, take it as soon as you can. If it is almost time for your next dose, take only that dose. Do not take double or extra doses. What may  interact with this medicine? Do not take this medicine with any of the following medications:  cidofovir This medicine may also interact with the following medications:  adefovir  amphotericin B  certain antibiotics like amikacin, gentamicin, tobramycin, vancomycin  cimetidine  cisplatin  colistin  cyclosporine  foscarnet  lithium  methotrexate  probenecid  tacrolimus This list may not describe all possible interactions. Give your health care provider a list of all the medicines, herbs, non-prescription drugs, or dietary supplements you use. Also tell them if you smoke, drink alcohol, or use illegal drugs. Some items may interact with your medicine. What should I watch for while using this medicine? Tell your doctor or health care professional if your symptoms do not start to get better after 1 week. This medicine works best when taken early in the course of an infection, within the first 70 hours. Begin treatment as soon as possible after the first signs of infection like tingling, itching, or pain in the affected area. It is possible that genital herpes may still be spread even when you are not having symptoms. Always use safer sex practices like condoms made of latex or polyurethane whenever you have sexual contact. You should stay well hydrated while taking this medicine. Drink plenty of fluids. What side effects may I notice from receiving this medicine? Side effects that you should report to your doctor or health care professional as soon as possible:  allergic reactions like skin rash, itching or hives, swelling of the face, lips, or tongue  aggressive behavior  confusion  hallucinations  problems  with balance, talking, walking  stomach pain  tremor  trouble passing urine or change in the amount of urine Side effects that usually do not require medical attention (report to your doctor or health care professional if they continue or are  bothersome):  dizziness  headache  nausea, vomiting This list may not describe all possible side effects. Call your doctor for medical advice about side effects. You may report side effects to FDA at 1-800-FDA-1088. Where should I keep my medicine? Keep out of the reach of children. Store at room temperature between 15 and 25 degrees C (59 and 77 degrees F). Keep container tightly closed. Throw away any unused medicine after the expiration date. NOTE: This sheet is a summary. It may not cover all possible information. If you have questions about this medicine, talk to your doctor, pharmacist, or health care provider.  2021 Elsevier/Gold Standard (2018-05-08 12:22:33)  Shingles  Shingles, which is also known as herpes zoster, is an infection that causes a painful skin rash and fluid-filled blisters. It is caused by a virus. Shingles only develops in people who:  Have had chickenpox.  Have been given a medicine to protect against chickenpox (have been vaccinated). Shingles is rare in this group. What are the causes? Shingles is caused by varicella-zoster virus (VZV). This is the same virus that causes chickenpox. After a person is exposed to VZV, the virus stays in the body in an inactive (dormant) state. Shingles develops if the virus is reactivated. This can happen many years after the first (initial) exposure to VZV. It is not known what causes this virus to be reactivated. What increases the risk? People who have had chickenpox or received the chickenpox vaccine are at risk for shingles. Shingles infection is more common in people who:  Are older than age 39.  Have a weakened disease-fighting system (immune system), such as people with: ? HIV. ? AIDS. ? Cancer.  Are taking medicines that weaken the immune system, such as transplant medicines.  Are experiencing a lot of stress. What are the signs or symptoms? Early symptoms of this condition include itching, tingling, and pain  in an area on your skin. Pain may be described as burning, stabbing, or throbbing. A few days or weeks after early symptoms start, a painful red rash appears. The rash is usually on one side of the body and has a band-like or belt-like pattern. The rash eventually turns into fluid-filled blisters that break open, change into scabs, and dry up in about 2-3 weeks. At any time during the infection, you may also develop:  A fever.  Chills.  A headache.  An upset stomach. How is this diagnosed? This condition is diagnosed with a skin exam. Skin or fluid samples may be taken from the blisters before a diagnosis is made. These samples are examined under a microscope or sent to a lab for testing. How is this treated? The rash may last for several weeks. There is not a specific cure for this condition. Your health care provider will probably prescribe medicines to help you manage pain, recover more quickly, and avoid long-term problems. Medicines may include:  Antiviral drugs.  Anti-inflammatory drugs.  Pain medicines.  Anti-itching medicines (antihistamines). If the area involved is on your face, you may be referred to a specialist, such as an eye doctor (ophthalmologist) or an ear, nose, and throat (ENT) doctor (otolaryngologist) to help you avoid eye problems, chronic pain, or disability. Follow these instructions at home: Medicines  Take  over-the-counter and prescription medicines only as told by your health care provider.  Apply an anti-itch cream or numbing cream to the affected area as told by your health care provider. Relieving itching and discomfort  Apply cold, wet cloths (cold compresses) to the area of the rash or blisters as told by your health care provider.  Cool baths can be soothing. Try adding baking soda or dry oatmeal to the water to reduce itching. Do not bathe in hot water.   Blister and rash care  Keep your rash covered with a loose bandage (dressing). Wear  loose-fitting clothing to help ease the pain of material rubbing against the rash.  Keep your rash and blisters clean by washing the area with mild soap and cool water as told by your health care provider.  Check your rash every day for signs of infection. Check for: ? More redness, swelling, or pain. ? Fluid or blood. ? Warmth. ? Pus or a bad smell.  Do not scratch your rash or pick at your blisters. To help avoid scratching: ? Keep your fingernails clean and cut short. ? Wear gloves or mittens while you sleep, if scratching is a problem. General instructions  Rest as told by your health care provider.  Keep all follow-up visits as told by your health care provider. This is important.  Wash your hands often with soap and water. If soap and water are not available, use hand sanitizer. Doing this lowers your chance of getting a bacterial skin infection.  Before your blisters change into scabs, your shingles infection can cause chickenpox in people who have never had it or have never been vaccinated against it. To prevent this from happening, avoid contact with other people, especially: ? Babies. ? Pregnant women. ? Children who have eczema. ? Elderly people who have transplants. ? People who have chronic illnesses, such as cancer or AIDS. Contact a health care provider if:  Your pain is not relieved with prescribed medicines.  Your pain does not get better after the rash heals.  You have signs of infection in the rash area, such as: ? More redness, swelling, or pain around the rash. ? Fluid or blood coming from the rash. ? The rash area feeling warm to the touch. ? Pus or a bad smell coming from the rash. Get help right away if:  The rash is on your face or nose.  You have facial pain, pain around your eye area, or loss of feeling on one side of your face.  You have difficulty seeing.  You have ear pain or have ringing in your ear.  You have a loss of taste.  Your  condition gets worse. Summary  Shingles, which is also known as herpes zoster, is an infection that causes a painful skin rash and fluid-filled blisters.  This condition is diagnosed with a skin exam. Skin or fluid samples may be taken from the blisters and examined before the diagnosis is made.  Keep your rash covered with a loose bandage (dressing). Wear loose-fitting clothing to help ease the pain of material rubbing against the rash.  Before your blisters change into scabs, your shingles infection can cause chickenpox in people who have never had it or have never been vaccinated against it. This information is not intended to replace advice given to you by your health care provider. Make sure you discuss any questions you have with your health care provider. Document Revised: 08/03/2018 Document Reviewed: 12/14/2016 Elsevier Patient Education  2021  [employer].
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
## 2020-09-29 ENCOUNTER — Other Ambulatory Visit: Payer: Self-pay | Admitting: Internal Medicine

## 2020-09-29 ENCOUNTER — Encounter: Payer: Self-pay | Admitting: Internal Medicine

## 2020-09-29 DIAGNOSIS — B029 Zoster without complications: Secondary | ICD-10-CM

## 2020-09-30 ENCOUNTER — Other Ambulatory Visit: Payer: Self-pay

## 2020-09-30 MED ORDER — CELECOXIB 200 MG PO CAPS: 200.0000 mg | ORAL_CAPSULE | Freq: Every day | ORAL | 0 refills | Status: DC

## 2020-09-30 NOTE — Telephone Encounter (Signed)
Pt is requesting refill tramadol.  Apparently saw Dr Olivia Mackie for shingles.  Was given 5 day tramadol rx for pain.  She also apparently just requested refill for celebrex, which it looks like Sarah sent in for 90 day supply.  With kidney function, needs to try to limit amount of antiinflammatories.  Regarding tramadol, this is not a medication we will keep her on regularly.  Need to confirm what she is taking for pain now, including otc pain.  Does she still feel needs tramadol?  Persistent pain?  Better?

## 2020-10-01 ENCOUNTER — Other Ambulatory Visit: Payer: Self-pay | Admitting: Internal Medicine

## 2020-10-01 MED ORDER — TRAMADOL HCL 50 MG PO TABS: 50.0000 mg | ORAL_TABLET | Freq: Two times a day (BID) | ORAL | 0 refills | Status: DC | PRN

## 2020-10-01 NOTE — Telephone Encounter (Signed)
Patient is using tramadol for pain and occasionally ibuprofen but that is rare. Says pain is more at night. More of a burning feeling. She does feel like the tramadol helps. This is not something that she wants to take regularly. She says that the shingles are some better but not completely.

## 2020-10-01 NOTE — Telephone Encounter (Signed)
Rx ok'd for tramadol #20 with no refills.

## 2020-10-01 NOTE — Telephone Encounter (Signed)
Pt has upcoming appt with you 6/23.

## 2020-10-02 MED ORDER — CILIDINIUM-CHLORDIAZEPOXIDE 2.5-5 MG PO CAPS: ORAL_CAPSULE | ORAL | 0 refills | Status: DC

## 2020-10-12 ENCOUNTER — Encounter: Payer: Self-pay | Admitting: Internal Medicine

## 2020-10-15 ENCOUNTER — Other Ambulatory Visit: Payer: Self-pay

## 2020-10-15 ENCOUNTER — Ambulatory Visit (INDEPENDENT_AMBULATORY_CARE_PROVIDER_SITE_OTHER): Payer: BC Managed Care – PPO

## 2020-10-15 ENCOUNTER — Ambulatory Visit
Admission: RE | Admit: 2020-10-15 | Discharge: 2020-10-15 | Disposition: A | Discharge status: Home / Self Care | Payer: BC Managed Care – PPO | Source: Ambulatory Visit | Attending: Internal Medicine | Admitting: Internal Medicine

## 2020-10-15 ENCOUNTER — Ambulatory Visit (INDEPENDENT_AMBULATORY_CARE_PROVIDER_SITE_OTHER): Payer: BC Managed Care – PPO | Admitting: Internal Medicine

## 2020-10-15 ENCOUNTER — Encounter: Payer: Self-pay | Admitting: Internal Medicine

## 2020-10-15 VITALS — BP 108/64 | HR 83 | Temp 97.9°F | Ht 60.24 in | Wt 150.6 lb

## 2020-10-15 DIAGNOSIS — N281 Cyst of kidney, acquired: Secondary | ICD-10-CM

## 2020-10-15 DIAGNOSIS — R519 Headache, unspecified: Secondary | ICD-10-CM

## 2020-10-15 DIAGNOSIS — N200 Calculus of kidney: Secondary | ICD-10-CM

## 2020-10-15 DIAGNOSIS — F439 Reaction to severe stress, unspecified: Secondary | ICD-10-CM

## 2020-10-15 DIAGNOSIS — Z1231 Encounter for screening mammogram for malignant neoplasm of breast: Secondary | ICD-10-CM

## 2020-10-15 DIAGNOSIS — M545 Low back pain, unspecified: Secondary | ICD-10-CM

## 2020-10-15 DIAGNOSIS — N183 Chronic kidney disease, stage 3 unspecified: Secondary | ICD-10-CM | POA: Diagnosis not present

## 2020-10-15 DIAGNOSIS — Z0000 Encounter for general adult medical examination without abnormal findings: Secondary | ICD-10-CM

## 2020-10-15 DIAGNOSIS — R9389 Abnormal findings on diagnostic imaging of other specified body structures: Secondary | ICD-10-CM

## 2020-10-15 DIAGNOSIS — M546 Pain in thoracic spine: Secondary | ICD-10-CM | POA: Diagnosis not present

## 2020-10-15 DIAGNOSIS — E876 Hypokalemia: Secondary | ICD-10-CM

## 2020-10-15 DIAGNOSIS — E7800 Pure hypercholesterolemia, unspecified: Secondary | ICD-10-CM

## 2020-10-15 DIAGNOSIS — R739 Hyperglycemia, unspecified: Secondary | ICD-10-CM

## 2020-10-15 DIAGNOSIS — F900 Attention-deficit hyperactivity disorder, predominantly inattentive type: Secondary | ICD-10-CM

## 2020-10-15 LAB — URINALYSIS, ROUTINE W REFLEX MICROSCOPIC
Bilirubin Urine: NEGATIVE
Hgb urine dipstick: NEGATIVE
Ketones, ur: NEGATIVE
Leukocytes,Ua: NEGATIVE
Nitrite: NEGATIVE
RBC / HPF: NONE SEEN (ref 0–?)
Specific Gravity, Urine: 1.02 (ref 1.000–1.030)
Total Protein, Urine: NEGATIVE
Urine Glucose: NEGATIVE
Urobilinogen, UA: 0.2 (ref 0.0–1.0)
pH: 6.5 (ref 5.0–8.0)

## 2020-10-15 LAB — BASIC METABOLIC PANEL
BUN: 18 mg/dL (ref 6–23)
CO2: 30 mEq/L (ref 19–32)
Calcium: 10 mg/dL (ref 8.4–10.5)
Chloride: 99 mEq/L (ref 96–112)
Creatinine, Ser: 1.18 mg/dL (ref 0.40–1.20)
GFR: 49.05 mL/min — ABNORMAL LOW (ref >60.00)
Glucose, Bld: 104 mg/dL — ABNORMAL HIGH (ref 70–99)
Potassium: 3.1 mEq/L — ABNORMAL LOW (ref 3.5–5.1)
Sodium: 140 mEq/L (ref 135–145)

## 2020-10-15 LAB — TSH: TSH: 2.43 u[IU]/mL (ref 0.35–4.50)

## 2020-10-15 MED ORDER — HYDROXYZINE PAMOATE 50 MG PO CAPS: 50.0000 mg | ORAL_CAPSULE | Freq: Every evening | ORAL | 1 refills | Status: DC | PRN

## 2020-10-15 MED ORDER — ROSUVASTATIN CALCIUM 5 MG PO TABS: ORAL_TABLET | ORAL | 1 refills | Status: DC

## 2020-10-15 MED ORDER — TIZANIDINE HCL 2 MG PO CAPS: 2.0000 mg | ORAL_CAPSULE | Freq: Every evening | ORAL | 0 refills | Status: DC | PRN
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
## 2020-10-15 NOTE — Assessment & Plan Note (Addendum)
Physical today 10/15/20.  S/p hysterectomy.  Mammogram overdue.  Schedule.

## 2020-10-16 ENCOUNTER — Telehealth: Payer: Self-pay

## 2020-10-16 ENCOUNTER — Other Ambulatory Visit: Payer: Self-pay | Admitting: Internal Medicine

## 2020-10-16 NOTE — Telephone Encounter (Signed)
LMTCB to give patient CT results.

## 2020-10-19 ENCOUNTER — Telehealth: Payer: Self-pay | Admitting: Internal Medicine

## 2020-10-19 ENCOUNTER — Other Ambulatory Visit: Payer: Self-pay | Admitting: Internal Medicine

## 2020-10-19 NOTE — Telephone Encounter (Signed)
-----   Message from Tyler Pita, MD sent at 10/17/2020  3:53 PM EDT ----- Regarding: RE: question I would just do PFTs to make sure there is no major airway obstruction. If PFTs are abnormal would be happy to see her.  LG ----- Message ----- From: Einar Pheasant, MD Sent: 10/17/2020   3:50 PM EDT To: Tyler Pita, MD Subject: question                                       [name] had a thoracic spine xray to evaluate some persistent back pain.  The xray revealed mild chronic bronchitic changes.  She occasionally will have some minimal cough from drainage, but no other significant pulmonary symptoms.  Would you recommend any further pulmonary work up (given the changes on her xray)? Thank you for your help.  If you need to see her, I can arrange an appointment.    Thanks again.  Judith Rios

## 2020-10-21 DIAGNOSIS — E876 Hypokalemia: Secondary | ICD-10-CM | POA: Diagnosis not present

## 2020-10-21 DIAGNOSIS — N1831 Chronic kidney disease, stage 3a: Secondary | ICD-10-CM | POA: Diagnosis not present

## 2020-10-21 DIAGNOSIS — I129 Hypertensive chronic kidney disease with stage 1 through stage 4 chronic kidney disease, or unspecified chronic kidney disease: Secondary | ICD-10-CM | POA: Diagnosis not present

## 2020-10-21 NOTE — Progress Notes (Signed)
Order placed for PFTs.

## 2020-10-25 ENCOUNTER — Telehealth: Payer: Self-pay | Admitting: Internal Medicine

## 2020-10-25 ENCOUNTER — Encounter: Payer: Self-pay | Admitting: Internal Medicine

## 2020-10-25 NOTE — Assessment & Plan Note (Signed)
Low carb diet and exercise.  Follow met b and a1c.  

## 2020-10-25 NOTE — Assessment & Plan Note (Signed)
Followed by urology.   

## 2020-10-25 NOTE — Assessment & Plan Note (Signed)
Head pain after injury - pressure injury as outlined.  Check CT.  Follow.

## 2020-10-25 NOTE — Assessment & Plan Note (Signed)
Taking potassium as outlined.  Followed by nephrology.  Recheck metabolic panel today.

## 2020-10-25 NOTE — Assessment & Plan Note (Signed)
On lipitor.  Follow lipid panel and liver function tests.  Low cholesterol diet and exercise

## 2020-10-25 NOTE — Telephone Encounter (Signed)
Per review, it appears she has not had a mammogram recently.  Order placed.  Need ot schedule.

## 2020-10-25 NOTE — Assessment & Plan Note (Signed)
Followed by Dr Rolly Salter.  Avoid antiinflammatories.  Stay hydrated.  Check metabolic panel.

## 2020-10-25 NOTE — Assessment & Plan Note (Signed)
Appears to be doing well off medication.  Follow.

## 2020-10-25 NOTE — Assessment & Plan Note (Signed)
Evaluated by urology recently.  Ultrasound stable cyst.  Follow.

## 2020-10-25 NOTE — Assessment & Plan Note (Addendum)
Back pain s/p fall as outlined.  No head injury with fall.  Given persistent pain, will check thoracic spine xray and L-S spine xray.  Follow.  Further w/up pending results.  zanaflex as directed.  Follow.

## 2020-10-25 NOTE — Assessment & Plan Note (Signed)
Increased stress as outlined.  Discussed with her today. Overall appears to be handling things relatively well.  Hold on additional medication.  Follow.  Requested refill hydralazine.

## 2020-10-28 NOTE — Telephone Encounter (Signed)
Mychart sent to patient.

## 2021-01-07 ENCOUNTER — Other Ambulatory Visit: Payer: Self-pay | Admitting: Internal Medicine

## 2021-01-10 ENCOUNTER — Other Ambulatory Visit: Payer: Self-pay | Admitting: Internal Medicine

## 2021-01-11 NOTE — Telephone Encounter (Signed)
QU Refill:Librax Last Seen:10-16-19 Last ordered:10-02-20

## 2021-02-11 ENCOUNTER — Other Ambulatory Visit: Payer: Self-pay

## 2021-02-11 ENCOUNTER — Telehealth: Payer: Self-pay | Admitting: Internal Medicine

## 2021-02-11 ENCOUNTER — Encounter: Payer: Self-pay | Admitting: Internal Medicine

## 2021-02-11 ENCOUNTER — Other Ambulatory Visit (INDEPENDENT_AMBULATORY_CARE_PROVIDER_SITE_OTHER): Payer: BC Managed Care – PPO

## 2021-02-11 DIAGNOSIS — R7989 Other specified abnormal findings of blood chemistry: Secondary | ICD-10-CM | POA: Diagnosis not present

## 2021-02-11 DIAGNOSIS — R739 Hyperglycemia, unspecified: Secondary | ICD-10-CM

## 2021-02-11 DIAGNOSIS — E7800 Pure hypercholesterolemia, unspecified: Secondary | ICD-10-CM

## 2021-02-11 DIAGNOSIS — N183 Chronic kidney disease, stage 3 unspecified: Secondary | ICD-10-CM | POA: Diagnosis not present

## 2021-02-11 LAB — LIPID PANEL
Cholesterol: 179 mg/dL (ref 0–200)
HDL: 55.6 mg/dL (ref >39.00)
LDL Cholesterol: 99 mg/dL (ref 0–99)
NonHDL: 123.87
Total CHOL/HDL Ratio: 3
Triglycerides: 126 mg/dL (ref 0.0–149.0)
VLDL: 25.2 mg/dL (ref 0.0–40.0)

## 2021-02-11 LAB — CBC WITH DIFFERENTIAL/PLATELET
Basophils Absolute: 0 10*3/uL (ref 0.0–0.1)
Basophils Relative: 0.9 % (ref 0.0–3.0)
Eosinophils Absolute: 0.2 10*3/uL (ref 0.0–0.7)
Eosinophils Relative: 3.8 % (ref 0.0–5.0)
HCT: 42.2 % (ref 36.0–46.0)
Hemoglobin: 14.6 g/dL (ref 12.0–15.0)
Lymphocytes Relative: 41.5 % (ref 12.0–46.0)
Lymphs Abs: 1.9 10*3/uL (ref 0.7–4.0)
MCHC: 34.6 g/dL (ref 30.0–36.0)
MCV: 86.7 fl (ref 78.0–100.0)
Monocytes Absolute: 0.4 10*3/uL (ref 0.1–1.0)
Monocytes Relative: 8.2 % (ref 3.0–12.0)
Neutro Abs: 2.1 10*3/uL (ref 1.4–7.7)
Neutrophils Relative %: 45.6 % (ref 43.0–77.0)
Platelets: 225 10*3/uL (ref 150.0–400.0)
RBC: 4.87 Mil/uL (ref 3.87–5.11)
RDW: 13.3 % (ref 11.5–15.5)
WBC: 4.7 10*3/uL (ref 4.0–10.5)

## 2021-02-11 LAB — BASIC METABOLIC PANEL
BUN: 18 mg/dL (ref 6–23)
CO2: 28 mEq/L (ref 19–32)
Calcium: 9.5 mg/dL (ref 8.4–10.5)
Chloride: 102 mEq/L (ref 96–112)
Creatinine, Ser: 1.12 mg/dL (ref 0.40–1.20)
GFR: 52.1 mL/min — ABNORMAL LOW (ref >60.00)
Glucose, Bld: 106 mg/dL — ABNORMAL HIGH (ref 70–99)
Potassium: 3.6 mEq/L (ref 3.5–5.1)
Sodium: 140 mEq/L (ref 135–145)

## 2021-02-11 LAB — HEPATIC FUNCTION PANEL
ALT: 29 U/L (ref 0–35)
AST: 22 U/L (ref 0–37)
Albumin: 4.4 g/dL (ref 3.5–5.2)
Alkaline Phosphatase: 56 U/L (ref 39–117)
Bilirubin, Direct: 0.1 mg/dL (ref 0.0–0.3)
Total Bilirubin: 0.5 mg/dL (ref 0.2–1.2)
Total Protein: 6.9 g/dL (ref 6.0–8.3)

## 2021-02-11 LAB — HEMOGLOBIN A1C: Hgb A1c MFr Bld: 5.8 % (ref 4.6–6.5)

## 2021-02-11 LAB — TSH: TSH: 4.39 u[IU]/mL (ref 0.35–5.50)

## 2021-02-11 NOTE — Telephone Encounter (Signed)
Pt was in the lab today getting her labs done. Pt stated she was having urine symptoms and left a urine sample.  Pt stated that she was going to contact Dr. Nicki Reaper about her urine symptoms.

## 2021-02-11 NOTE — Telephone Encounter (Signed)
LMTCB

## 2021-02-11 NOTE — Telephone Encounter (Signed)
See attached - need information about urinary symptoms and why urine needed.

## 2021-02-12 ENCOUNTER — Other Ambulatory Visit: Payer: Self-pay

## 2021-02-12 MED ORDER — ROSUVASTATIN CALCIUM 5 MG PO TABS: 5.0000 mg | ORAL_TABLET | Freq: Every day | ORAL | 2 refills | Status: DC

## 2021-02-12 NOTE — Telephone Encounter (Signed)
See phone note for documentation.

## 2021-02-12 NOTE — Telephone Encounter (Signed)
Patient is returning your call,please call her at [phone].

## 2021-02-12 NOTE — Telephone Encounter (Signed)
Patient returned your call.

## 2021-02-12 NOTE — Telephone Encounter (Signed)
Spoke with patient regarding her lab results. Advised that her urine was not ran because she does not have an upcoming appt with Dr Nicki Reaper and was not able to reach patient to triage. Pt sent my chart message complaining of some low back pain. Advised that visit is required in order to have urine orders put in. Advised since it is Friday afternoon, if symptoms persist or worsen over the weekend she needs to be seen. If no change in sx and feels she needs visit next week- can try to work her in. Patient is aware and gave verbal understanding

## 2021-04-22 ENCOUNTER — Ambulatory Visit
Admission: EM | Admit: 2021-04-22 | Discharge: 2021-04-22 | Disposition: A | Discharge status: Home / Self Care | Payer: BC Managed Care – PPO

## 2021-04-22 ENCOUNTER — Telehealth: Payer: Self-pay | Admitting: Internal Medicine

## 2021-04-22 ENCOUNTER — Telehealth: Payer: BC Managed Care – PPO | Admitting: Family

## 2021-04-22 ENCOUNTER — Other Ambulatory Visit: Payer: Self-pay | Admitting: Internal Medicine

## 2021-04-22 ENCOUNTER — Other Ambulatory Visit: Payer: Self-pay

## 2021-04-22 DIAGNOSIS — R052 Subacute cough: Secondary | ICD-10-CM | POA: Diagnosis not present

## 2021-04-22 DIAGNOSIS — R0981 Nasal congestion: Secondary | ICD-10-CM | POA: Diagnosis not present

## 2021-04-22 DIAGNOSIS — Z20828 Contact with and (suspected) exposure to other viral communicable diseases: Secondary | ICD-10-CM | POA: Diagnosis not present

## 2021-04-22 DIAGNOSIS — B349 Viral infection, unspecified: Secondary | ICD-10-CM

## 2021-04-22 DIAGNOSIS — K047 Periapical abscess without sinus: Secondary | ICD-10-CM

## 2021-04-22 DIAGNOSIS — R1112 Projectile vomiting: Secondary | ICD-10-CM

## 2021-04-22 LAB — POCT INFLUENZA A/B
Influenza A, POC: NEGATIVE
Influenza B, POC: NEGATIVE

## 2021-04-22 MED ORDER — BENZONATATE 100 MG PO CAPS: 100.0000 mg | ORAL_CAPSULE | Freq: Three times a day (TID) | ORAL | 0 refills | Status: DC | PRN

## 2021-04-22 MED ORDER — PENICILLIN V POTASSIUM 500 MG PO TABS: 500.0000 mg | ORAL_TABLET | Freq: Three times a day (TID) | ORAL | 0 refills | Status: DC

## 2021-04-22 MED ORDER — PROMETHAZINE-DM 6.25-15 MG/5ML PO SYRP: 5.0000 mL | ORAL_SOLUTION | Freq: Every evening | ORAL | 0 refills | Status: DC | PRN

## 2021-04-22 MED ORDER — LIDOCAINE VISCOUS HCL 2 % MT SOLN: 15.0000 mL | Freq: Two times a day (BID) | OROMUCOSAL | 0 refills | Status: DC | PRN

## 2021-04-22 NOTE — Telephone Encounter (Signed)
Patient called back returning my call and wanted to let me know how "unpleasant" her phone call was with the nurse that talked to. They were trying to send her to the ED. Explained to patient that per triage note, she noted difficulty breathing and they have a protocol to follow. Patient gave verbal understanding. She was evaluated and treated at urgent care- will let me know if she needs anything further. Rapid flu test was negative. PCR COVID and FLU pending. Advised patient to quarantine until results come back. She was exposed to the flu. Advised to let us know if she needed anything further.

## 2021-04-22 NOTE — Telephone Encounter (Signed)
Patient called in stated that she and husband having symptoms soar throat, coughing, sneezing, appetite change and stuffy and runny nose. I schedule video appt for today and  xfer to Access Nurse

## 2021-04-22 NOTE — Telephone Encounter (Signed)
Patient was evaluated at [facility]. Have not been able to reach

## 2021-04-22 NOTE — Discharge Instructions (Addendum)
Make sure you follow-up with your dentist as soon as possible.  I did prescribe you the antibiotic they normally use for your dental infections.  We will be using penicillin 3 times daily for 10 days.  I did prescribe you some viscous lidocaine that you can apply locally to the area that hurts in the mouth.  We will let you know about your COVID and influenza test results only if they are positive.  For negative results we will send you a message through [facility]. Otherwise, we will manage this as a viral illness. For sore throat or cough try using a honey-based tea. Use 3 teaspoons of honey with juice squeezed from half lemon. Place shaved pieces of ginger into 1/2-1 cup of water and warm over stove top. Then mix the ingredients and repeat every 4 hours as needed. Please take ibuprofen 600mg  every 6 hours with food alternating with OR taken together with Tylenol 500mg -650mg  every 6 hours for throat pain, fevers, aches and pains. Hydrate very well with at least 2 liters of water. Eat light meals such as soups (chicken and noodles, vegetable, chicken and wild rice).  Do not eat foods that you are allergic to.  Taking an antihistamine like Zyrtec can help against postnasal drainage, sinus congestion.  You can take this together with pseudoephedrine (Sudafed) at a dose of 30-60 mg 3 times a day or twice daily as needed for the same kind of nasal drip, congestion.  However, limit your use of pseudoephedrine if you have high blood pressure or avoid altogether if you have abnormal heart rhythms, heart condition.

## 2021-04-22 NOTE — Telephone Encounter (Signed)
LM to return call.

## 2021-04-22 NOTE — Telephone Encounter (Signed)
Pt has virtual visit today

## 2021-04-22 NOTE — Telephone Encounter (Signed)
Noted. Is she improved now ?

## 2021-04-22 NOTE — ED Triage Notes (Signed)
Pt present coughing with SOB and congestion. Symptoms started two nights ago. Pt states her daughter tested positive for flu A and she has been around her.  Pt state her cough is dry with some mucous.

## 2021-04-22 NOTE — Telephone Encounter (Signed)
° ° °  TRIAGE NOTE ATTACHED
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
## 2021-04-23 NOTE — Telephone Encounter (Signed)
LM for patient to return call.

## 2021-04-24 LAB — COVID-19, FLU A+B NAA
Influenza A, NAA: DETECTED — AB
Influenza B, NAA: NOT DETECTED
SARS-CoV-2, NAA: NOT DETECTED

## 2021-04-29 NOTE — Telephone Encounter (Signed)
Spoke to nurse.  Does not need rx at this time.  Will call if persistent symptoms.

## 2021-04-29 NOTE — Telephone Encounter (Signed)
FYI  Called patient to follow up. Tested positive for the flu last week at urgent care. Using tessalon perles for cough. Patient is almost out requesting refill. Advised visit may be needed in order to refill so we can f/u. Patient stated she is out of town and declined visit, She is going to use OTC cough syrup alternating with the tessalon perles and f/u next week. Agreed to do VV next week if not better.

## 2021-04-30 ENCOUNTER — Telehealth: Payer: Self-pay | Admitting: Internal Medicine

## 2021-04-30 MED ORDER — CILIDINIUM-CHLORDIAZEPOXIDE 2.5-5 MG PO CAPS: ORAL_CAPSULE | ORAL | 0 refills | Status: DC

## 2021-04-30 NOTE — Telephone Encounter (Signed)
Rx sent in for librax #270 with no refills.  Needs a f//u appt with me.  Also, rx in box needs to be discarded.  I did not give a refill.  Needs f/u appt before next appt.

## 2021-05-04 NOTE — Telephone Encounter (Signed)
Patient aware of below. Had the flu end of dec. Scheduled for VV to be evaluated. Questioning need of abx. Was advised last week that would need visit if cough not resolved this week.

## 2021-05-06 ENCOUNTER — Encounter: Payer: Self-pay | Admitting: Internal Medicine

## 2021-05-06 ENCOUNTER — Telehealth (INDEPENDENT_AMBULATORY_CARE_PROVIDER_SITE_OTHER): Payer: BC Managed Care – PPO | Admitting: Internal Medicine

## 2021-05-06 DIAGNOSIS — F439 Reaction to severe stress, unspecified: Secondary | ICD-10-CM

## 2021-05-06 DIAGNOSIS — E7800 Pure hypercholesterolemia, unspecified: Secondary | ICD-10-CM | POA: Diagnosis not present

## 2021-05-06 DIAGNOSIS — R739 Hyperglycemia, unspecified: Secondary | ICD-10-CM | POA: Diagnosis not present

## 2021-05-06 DIAGNOSIS — R059 Cough, unspecified: Secondary | ICD-10-CM

## 2021-05-06 MED ORDER — BENZONATATE 100 MG PO CAPS: 100.0000 mg | ORAL_CAPSULE | Freq: Three times a day (TID) | ORAL | 0 refills | Status: DC | PRN

## 2021-05-06 MED ORDER — PREDNISONE 10 MG PO TABS: ORAL_TABLET | ORAL | 0 refills | Status: DC
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
## 2021-05-11 ENCOUNTER — Telehealth: Payer: Self-pay | Admitting: Internal Medicine

## 2021-05-11 ENCOUNTER — Encounter: Payer: Self-pay | Admitting: Internal Medicine

## 2021-05-11 DIAGNOSIS — R059 Cough, unspecified: Secondary | ICD-10-CM | POA: Insufficient documentation

## 2021-05-11 NOTE — Telephone Encounter (Signed)
Pt called in stating that the prednisone is not working and she is getting worse. Pt said it did work for a few days but it stopped working for her. Pt cough is getting deeper, coughing up thick green stuff and pt is having bad coughing spells. Pt mom had a bad stroke and does not  expect to live long so pt want to get some medication in ASAP so it can help her to care for her mom. Pt does not have a fever but when she has the coughing spells she gets sweaty

## 2021-05-11 NOTE — Telephone Encounter (Signed)
FYI I called to triage patient & she stated that her cough had worsened since seeing you virtually last week. She was not SOB unless she had a bad coughing spell. She is now spitting up green phlegm as well that she was not previously. She said that initially she thought that she was getting better on prednisone & then the congestion became worse with cough. She is scheduled for VV with Dr. Olivia Mackie at 9 am & patient instructed to go to ED or UC if any worsening sx before her visit in the morning or if SOB developed ED.

## 2021-05-11 NOTE — Assessment & Plan Note (Signed)
Increased stress as outlined.  Discussed with her today. Overall appears to be handling things relatively well.  Hold on additional medication.  Follow.

## 2021-05-11 NOTE — Assessment & Plan Note (Signed)
Recently diagnosed with flu.  Intolerant to tamiflu.  Was treated with abx - for tooth abscess.  Reports she is doing better, but still has persistent cough - some coughing fits.  Appetite is improving.  Discussed - saline nasal spray and steroid nasal spray.  Prednisone taper as directed.  Hold further abx at this time.  Refilled tessalon perles.  If persisitent issues will need further evaluation, including cxr, etc.  Follow.  Call with update.

## 2021-05-11 NOTE — Assessment & Plan Note (Signed)
Prednisone can increase blood sugars.  Stay hydrated.  Low carb diet and exercise.  Follow met b and a1c.

## 2021-05-11 NOTE — Assessment & Plan Note (Signed)
On lipitor

## 2021-05-12 ENCOUNTER — Other Ambulatory Visit: Payer: Self-pay

## 2021-05-12 ENCOUNTER — Ambulatory Visit (INDEPENDENT_AMBULATORY_CARE_PROVIDER_SITE_OTHER): Payer: BC Managed Care – PPO | Admitting: Internal Medicine

## 2021-05-12 ENCOUNTER — Encounter: Payer: Self-pay | Admitting: Internal Medicine

## 2021-05-12 VITALS — Ht 60.24 in | Wt 132.0 lb

## 2021-05-12 DIAGNOSIS — R058 Other specified cough: Secondary | ICD-10-CM | POA: Diagnosis not present

## 2021-05-12 DIAGNOSIS — B3731 Acute candidiasis of vulva and vagina: Secondary | ICD-10-CM

## 2021-05-12 DIAGNOSIS — J40 Bronchitis, not specified as acute or chronic: Secondary | ICD-10-CM | POA: Diagnosis not present

## 2021-05-12 DIAGNOSIS — R0989 Other specified symptoms and signs involving the circulatory and respiratory systems: Secondary | ICD-10-CM | POA: Diagnosis not present

## 2021-05-12 MED ORDER — BENZONATATE 100 MG PO CAPS: 200.0000 mg | ORAL_CAPSULE | Freq: Three times a day (TID) | ORAL | 0 refills | Status: DC | PRN

## 2021-05-12 MED ORDER — AMOXICILLIN-POT CLAVULANATE 875-125 MG PO TABS
1.0000 | ORAL_TABLET | Freq: Two times a day (BID) | ORAL | 0 refills | Status: DC
Start: 2021-05-12 — End: 2021-06-16

## 2021-05-12 MED ORDER — FLUCONAZOLE 150 MG PO TABS: 150.0000 mg | ORAL_TABLET | Freq: Once | ORAL | 0 refills | Status: AC

## 2021-05-12 MED ORDER — PROAIR RESPICLICK 108 (90 BASE) MCG/ACT IN AEPB: 1.0000 | INHALATION_SPRAY | RESPIRATORY_TRACT | 0 refills | Status: DC | PRN

## 2021-05-12 MED ORDER — HYDROCOD POLST-CPM POLST ER 10-8 MG/5ML PO SUER: 5.0000 mL | Freq: Every evening | ORAL | 0 refills | Status: DC | PRN
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
## 2021-05-12 NOTE — Progress Notes (Signed)
Patient had type A flu 04/22/22. Was treated with antibiotics and cough syrup. Patient then followed up with DR scott and was given Prednisone and tessalon Perles.   Still having chest congestion and cough. Patient is on her 7th day of prednisone.

## 2021-06-16 ENCOUNTER — Encounter: Payer: Self-pay | Admitting: Internal Medicine

## 2021-06-16 ENCOUNTER — Other Ambulatory Visit: Payer: Self-pay

## 2021-06-16 ENCOUNTER — Ambulatory Visit: Payer: BC Managed Care – PPO | Admitting: Internal Medicine

## 2021-06-16 VITALS — BP 100/60 | HR 92 | Temp 98.4°F | Ht 60.0 in | Wt 140.6 lb

## 2021-06-16 DIAGNOSIS — R0602 Shortness of breath: Secondary | ICD-10-CM

## 2021-06-16 DIAGNOSIS — G4733 Obstructive sleep apnea (adult) (pediatric): Secondary | ICD-10-CM

## 2021-06-16 MED ORDER — ALBUTEROL SULFATE HFA 108 (90 BASE) MCG/ACT IN AERS: 2.0000 | INHALATION_SPRAY | Freq: Four times a day (QID) | RESPIRATORY_TRACT | 5 refills | Status: DC | PRN

## 2021-06-16 NOTE — Patient Instructions (Addendum)
Please schedule follow up scheduled with myself in 1 months.  If my schedule is not open yet, we will contact you with a reminder closer to that time. Please call [phone] if you haven't heard from [place] a month before.   Before your next visit I would like you to have:  Full set of PFTs - 1 hour, see me after   Take the albuterol rescue inhaler every 4 to 6 hours as needed for wheezing or shortness of breath. You can also take it 15 minutes before exercise or exertional activity. Side effects include heart racing or pounding, jitters or anxiety. If you have a history of an irregular heart rhythm, it can make this worse. Can also give some patients a hard time sleeping.  To inhale the aerosol using an inhaler, follow these steps:  Remove the protective dust cap from the end of the mouthpiece. If the dust cap was not placed on the mouthpiece, check the mouthpiece for dirt or other objects. Be sure that the canister is fully and firmly inserted in the mouthpiece. 2. If you are using the inhaler for the first time or if you have not used the inhaler in more than 14 days, you will need to prime it. You may also need to prime the inhaler if it has been dropped. Ask your pharmacist or check the manufacturer's information if this happens. To prime the inhaler, shake it well and then press down on the canister 4 times to release 4 sprays into the air, away from your face. Be careful not to get albuterol in your eyes. 3. Shake the inhaler well. 4. Breathe out as completely as possible through your mouth. 4. Hold the canister with the mouthpiece on the bottom, facing you and the canister pointing upward. Place the open end of the mouthpiece into your mouth. Close your lips tightly around the mouthpiece. 6. Breathe in slowly and deeply through the mouthpiece.At the same time, press down once on the container to spray the medication into your mouth. 7. Try to hold your breath for 10 seconds. remove the  inhaler, and breathe out slowly. 8. If you were told to use 2 puffs, wait 1 minute and then repeat steps 3-7. 9. Replace the protective cap on the inhaler. 10. Clean your inhaler regularly. Follow the manufacturer's directions carefully and ask your doctor or pharmacist if you have any questions about cleaning your inhaler.  Check the back of the inhaler to keep track of the total number of doses left on the inhaler.    By learning about asthma and how it can be controlled, you take an important step toward managing this disease. Work closely with your asthma care team to learn all you can about your asthma, how to avoid triggers, what your medications do, and how to take them correctly. With proper care, you can live free of asthma symptoms and maintain a normal, healthy lifestyle.   What is asthma? Asthma is a chronic disease that affects the airways of the lungs. During normal breathing, the bands of muscle that surround the airways are relaxed and air moves freely. During an asthma episode or "attack," there are three main changes that stop air from moving easily through the airways: The bands of muscle that surround the airways tighten and make the airways narrow. This tightening is called bronchospasm.  The lining of the airways becomes swollen or inflamed.  The cells that line the airways produce more mucus, which is thicker than  normal and clogs the airways.  These three factors - bronchospasm, inflammation, and mucus production - cause symptoms such as difficulty breathing, wheezing, and coughing.  What are the most common symptoms of asthma? Asthma symptoms are not the same for everyone. They can even change from episode to episode in the same person. Also, you may have only one symptom of asthma, such as cough, but another person may have all the symptoms of asthma. It is important to know all the symptoms of asthma and to be aware that your asthma can present in any of these ways at any  time. The most common symptoms include: Coughing, especially at night  Shortness of breath  Wheezing  Chest tightness, pain, or pressure   Who is affected by asthma? Asthma affects 22 million Americans; about 6 million of these are children under age 41. People who have a family history of asthma have an increased risk of developing the disease. Asthma is also more common in people who have allergies or who are exposed to tobacco smoke. However, anyone can develop asthma at any time. Some people may have asthma all of their lives, while others may develop it as adults.  What causes asthma? The airways in a person with asthma are very sensitive and react to many things, or "triggers." Contact with these triggers causes asthma symptoms. One of the most important parts of asthma control is to identify your triggers and then avoid them when possible. The only trigger you do not want to avoid is exercise. Pre-treatment with medicines before exercise can allow you to stay active yet avoid asthma symptoms. Common asthma triggers include: Infections (colds, viruses, flu, sinus infections)  Exercise  Weather (changes in temperature and/or humidity, cold air)  Tobacco smoke  Allergens (dust mites, pollens, pets, mold spores, cockroaches, and sometimes foods)  Irritants (strong odors from cleaning products, perfume, wood smoke, air pollution)  Strong emotions such as crying or laughing hard  Some medications   How is asthma diagnosed? To diagnose asthma, your doctor will first review your medical history, family history, and symptoms. Your doctor will want to know any past history of breathing problems you may have had, as well as a family history of asthma, allergies, eczema (a bumpy, itchy skin rash caused by allergies), or other lung disease. It is important that you describe your symptoms in detail (cough, wheeze, shortness of breath, chest tightness), including when and how often they occur. The  doctor will perform a physical examination and listen to your heart and lungs. He or she may also order breathing tests, allergy tests, blood tests, and chest and sinus X-rays. The tests will find out if you do have asthma and if there are any other conditions that are contributing factors.  How is asthma treated? Asthma can be controlled, but not cured. It is not normal to have frequent symptoms, trouble sleeping, or trouble completing tasks. Appropriate asthma care will prevent symptoms and visits to the emergency room and hospital. Asthma medicines are one of the mainstays of asthma treatment. The drugs used to treat asthma are explained below.  Anti-inflammatories: These are the most important drugs for most people with asthma. Anti-inflammatory drugs reduce swelling and mucus production in the airways. As a result, airways are less sensitive and less likely to react to triggers. These medications need to be taken daily and may need to be taken for several weeks before they begin to control asthma. Anti-inflammatory medicines lead to fewer symptoms, better airflow,  less sensitive airways, less airway damage, and fewer asthma attacks. If taken every day, they CONTROL or prevent asthma symptoms.   Bronchodilators: These drugs relax the muscle bands that tighten around the airways. This action opens the airways, letting more air in and out of the lungs and improving breathing. Bronchodilators also help clear mucus from the lungs. As the airways open, the mucus moves more freely and can be coughed out more easily. In short-acting forms, bronchodilators RELIEVE or stop asthma symptoms by quickly opening the airways and are very helpful during an asthma episode. In long-acting forms, bronchodilators provide CONTROL of asthma symptoms and prevent asthma episodes.  Asthma drugs can be taken in a variety of ways. Inhaling the medications by using a metered dose inhaler, dry powder inhaler, or nebulizer is one way  of taking asthma medicines. Oral medicines (pills or liquids you swallow) may also be prescribed.  Asthma severity Asthma is classified as either "intermittent" (comes and goes) or "persistent" (lasting). Persistent asthma is further described as being mild, moderate, or severe. The severity of asthma is based on how often you have symptoms both during the day and night, as well as by the results of lung function tests and by how well you can perform activities. The "severity" of asthma refers to how "intense" or "strong" your asthma is.  Asthma control Asthma control is the goal of asthma treatment. Regardless of your asthma severity, it may or may not be controlled. Asthma control means: You are able to do everything you want to do at work and home  You have no (or minimal) asthma symptoms  You do not wake up from your sleep or earlier than usual in the morning due to asthma  You rarely need to use your reliever medicine (inhaler)  Another major part of your treatment is that you are happy with your asthma care and believe your asthma is controlled.  Monitoring symptoms A key part of treatment is keeping track of how well your lungs are working. Monitoring your symptoms  what they are, how and when they happen, and how severe they are  is an important part of being able to control your asthma.  Sometimes asthma is monitored using a peak flow meter. A peak flow (PF) meter measures how fast the air comes out of your lungs. It can help you know when your asthma is getting worse, sometimes even before you have symptoms. By taking daily peak flow readings, you can learn when to adjust medications to keep asthma under good control. It is also used to create your asthma action plan (see below). Your doctor can use your peak flow readings to adjust your treatment plan in some cases.  Asthma Action Plan Based on your history and asthma severity, you and your doctor will develop a care plan called an  asthma action plan. The asthma action plan describes when and how to use your medicines, actions to take when asthma worsens, and when to seek emergency care. Make sure you understand this plan. If you do not, ask your asthma care provider any questions you may have. Your asthma action plan is one of the keys to controlling asthma. Keep it readily available to remind you of what you need to do every day to control asthma and what you need to do when symptoms occur.  Goals of asthma therapy These are the goals of asthma treatment: Live an active, normal life  Prevent chronic and troublesome symptoms  Attend work or  school every day  Perform daily activities without difficulty  Stop urgent visits to the doctor, emergency department, or hospital  Use and adjust medications to control asthma with few or no side effects

## 2021-06-16 NOTE — Progress Notes (Signed)
The patient has been prescribed the inhaler albuterol. Inhaler technique was demonstrated to patient. The patient subsequently demonstrated correct technique.  
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
## 2021-08-10 ENCOUNTER — Other Ambulatory Visit: Payer: Self-pay | Admitting: Internal Medicine

## 2021-08-18 ENCOUNTER — Ambulatory Visit: Payer: BC Managed Care – PPO | Admitting: Internal Medicine

## 2021-08-18 ENCOUNTER — Ambulatory Visit (INDEPENDENT_AMBULATORY_CARE_PROVIDER_SITE_OTHER): Payer: BC Managed Care – PPO | Admitting: Internal Medicine

## 2021-08-18 ENCOUNTER — Encounter: Payer: Self-pay | Admitting: Internal Medicine

## 2021-08-18 VITALS — BP 124/62 | HR 114 | Temp 98.0°F | Ht 61.0 in | Wt 143.6 lb

## 2021-08-18 DIAGNOSIS — R0789 Other chest pain: Secondary | ICD-10-CM | POA: Diagnosis not present

## 2021-08-18 DIAGNOSIS — R0683 Snoring: Secondary | ICD-10-CM

## 2021-08-18 DIAGNOSIS — R0602 Shortness of breath: Secondary | ICD-10-CM | POA: Diagnosis not present

## 2021-08-18 LAB — PULMONARY FUNCTION TEST
DL/VA % pred: 134 %
DL/VA: 5.79 ml/min/mmHg/L
DLCO cor % pred: 117 %
DLCO cor: 20.32 ml/min/mmHg
DLCO unc % pred: 117 %
DLCO unc: 20.32 ml/min/mmHg
FEF 25-75 Post: 1.22 L/sec
FEF 25-75 Pre: 1.38 L/sec
FEF2575-%Change-Post: -11 %
FEF2575-%Pred-Post: 62 %
FEF2575-%Pred-Pre: 70 %
FEV1-%Change-Post: 0 %
FEV1-%Pred-Post: 78 %
FEV1-%Pred-Pre: 78 %
FEV1-Post: 1.64 L
FEV1-Pre: 1.63 L
FEV1FVC-%Change-Post: 0 %
FEV1FVC-%Pred-Pre: 94 %
FEV6-%Change-Post: 0 %
FEV6-%Pred-Post: 86 %
FEV6-%Pred-Pre: 86 %
FEV6-Post: 2.26 L
FEV6-Pre: 2.24 L
FEV6FVC-%Pred-Post: 104 %
FEV6FVC-%Pred-Pre: 104 %
FVC-%Change-Post: 0 %
FVC-%Pred-Post: 83 %
FVC-%Pred-Pre: 82 %
FVC-Post: 2.26 L
FVC-Pre: 2.24 L
Post FEV1/FVC ratio: 72 %
Post FEV6/FVC ratio: 100 %
Pre FEV1/FVC ratio: 73 %
Pre FEV6/FVC Ratio: 100 %
RV % pred: 84 %
RV: 1.58 L
TLC % pred: 91 %
TLC: 4.09 L

## 2021-08-18 NOTE — Progress Notes (Signed)
Full PFT performed today. °

## 2021-08-18 NOTE — Patient Instructions (Signed)
Please schedule follow up scheduled with myself in 4 months.  If my schedule is not open yet, we will contact you with a reminder closer to that time. Please call [phone] if you haven't heard from [place] a month before.  ? ?Before your next visit I would like you to have: ? ?Home sleep study - we will call you to set this up.  ?If this is negative and does not show sleep apnea, we will proceed with in lab sleep study.  ? ?I will need to see you between 31-90 days of using your CPAP machine. You might need to move your appointment depending on when you get the machine.  ? ?Your lung function was normal. No need to continue inhalers. ? ?Follow up with Dr. Rockey Situ about your fast heart rate. I will send him a message. ? ?
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
## 2021-08-18 NOTE — Patient Instructions (Signed)
Full PFT performed today. °

## 2021-08-20 ENCOUNTER — Encounter: Payer: Self-pay | Admitting: Internal Medicine

## 2021-08-20 ENCOUNTER — Telehealth (INDEPENDENT_AMBULATORY_CARE_PROVIDER_SITE_OTHER): Payer: BC Managed Care – PPO | Admitting: Internal Medicine

## 2021-08-20 DIAGNOSIS — Z1231 Encounter for screening mammogram for malignant neoplasm of breast: Secondary | ICD-10-CM | POA: Diagnosis not present

## 2021-08-20 DIAGNOSIS — R079 Chest pain, unspecified: Secondary | ICD-10-CM

## 2021-08-20 DIAGNOSIS — E7800 Pure hypercholesterolemia, unspecified: Secondary | ICD-10-CM

## 2021-08-20 DIAGNOSIS — R000 Tachycardia, unspecified: Secondary | ICD-10-CM

## 2021-08-20 DIAGNOSIS — F439 Reaction to severe stress, unspecified: Secondary | ICD-10-CM

## 2021-08-20 DIAGNOSIS — N183 Chronic kidney disease, stage 3 unspecified: Secondary | ICD-10-CM | POA: Diagnosis not present

## 2021-08-20 DIAGNOSIS — N200 Calculus of kidney: Secondary | ICD-10-CM

## 2021-08-20 DIAGNOSIS — R739 Hyperglycemia, unspecified: Secondary | ICD-10-CM

## 2021-08-20 DIAGNOSIS — N281 Cyst of kidney, acquired: Secondary | ICD-10-CM

## 2021-08-20 DIAGNOSIS — G479 Sleep disorder, unspecified: Secondary | ICD-10-CM
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
## 2021-08-26 ENCOUNTER — Encounter: Payer: Self-pay | Admitting: Medical

## 2021-08-26 ENCOUNTER — Ambulatory Visit: Payer: BC Managed Care – PPO | Admitting: Medical

## 2021-08-26 ENCOUNTER — Ambulatory Visit (INDEPENDENT_AMBULATORY_CARE_PROVIDER_SITE_OTHER): Payer: BC Managed Care – PPO

## 2021-08-26 VITALS — BP 90/62 | HR 86 | Ht 60.0 in | Wt 142.0 lb

## 2021-08-26 DIAGNOSIS — G4733 Obstructive sleep apnea (adult) (pediatric): Secondary | ICD-10-CM

## 2021-08-26 DIAGNOSIS — R0789 Other chest pain: Secondary | ICD-10-CM

## 2021-08-26 DIAGNOSIS — R002 Palpitations: Secondary | ICD-10-CM | POA: Diagnosis not present

## 2021-08-26 DIAGNOSIS — I959 Hypotension, unspecified: Secondary | ICD-10-CM

## 2021-08-26 DIAGNOSIS — R011 Cardiac murmur, unspecified: Secondary | ICD-10-CM | POA: Diagnosis not present

## 2021-08-26 DIAGNOSIS — E7800 Pure hypercholesterolemia, unspecified: Secondary | ICD-10-CM

## 2021-08-26 DIAGNOSIS — N200 Calculus of kidney: Secondary | ICD-10-CM
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
## 2021-08-26 NOTE — Patient Instructions (Signed)
Medication Instructions:  ?Your physician recommends that you continue on your current medications as directed. Please refer to the Current Medication list given to you today. ? ?*If you need a refill on your cardiac medications before your next appointment, please call your pharmacy* ? ? ?Lab Work: ?None ordered ?If you have labs (blood work) drawn today and your tests are completely normal, you will receive your results only by: ?MyChart Message (if you have MyChart) OR ?A paper copy in the mail ?If you have any lab test that is abnormal or we need to change your treatment, we will call you to review the results. ? ? ?Testing/Procedures: ?Your physician has requested that you have an echocardiogram. Echocardiography is a painless test that uses sound waves to create images of your heart. It provides your doctor with information about the size and shape of your heart and how well your heart?s chambers and valves are working. This procedure takes approximately one hour. There are no restrictions for this procedure. ? ?Your provider has ordered a heart monitor to wear for 14 days. This will be mailed to your home with instructions on placement. Once you have finished the time frame requested, you will return monitor in box provided. ? ?  ? ? ?Follow-Up: ?At [facility], you and your health needs are our priority.  As part of our continuing mission to provide you with exceptional heart care, we have created designated Provider Care Teams.  These Care Teams include your primary Cardiologist (physician) and Advanced Practice Providers (APPs -  Physician Assistants and Nurse Practitioners) who all work together to provide you with the care you need, when you need it. ? ?We recommend signing up for the patient portal called "MyChart".  Sign up information is provided on this After Visit Summary.  MyChart is used to connect with patients for Virtual Visits (Telemedicine).  Patients are able to view lab/test results,  encounter notes, upcoming appointments, etc.  Non-urgent messages can be sent to your provider as well.   ?To learn more about what you can do with MyChart, go to [email or web address].   ? ?Your next appointment:   ?3 month(s) ? ?The format for your next appointment:   ?In Person ? ?Provider:   ?You may see Donalynn Furlong, MD or one of the following Advanced Practice Providers on your designated Care Team:   ?Murray Hodgkins, NP ?Christell Faith, PA-C ?Cadence Kathlen Mody, PA-C ? ? ?Other Instructions ?N/A ? ?Important Information About Sugar ? ? ? ? ? ? ?

## 2021-08-30 ENCOUNTER — Encounter: Payer: Self-pay | Admitting: Internal Medicine

## 2021-08-30 ENCOUNTER — Telehealth: Payer: Self-pay | Admitting: Internal Medicine

## 2021-08-30 DIAGNOSIS — R000 Tachycardia, unspecified: Secondary | ICD-10-CM | POA: Insufficient documentation

## 2021-08-30 NOTE — Assessment & Plan Note (Signed)
Evaluated by urology recently.  Ultrasound stable cyst.  Follow.  ?

## 2021-08-30 NOTE — Assessment & Plan Note (Signed)
Low carb diet and exercise.  Follow met b and a1c.  ?

## 2021-08-30 NOTE — Assessment & Plan Note (Signed)
Described chest tightness (intermittent) and increased heart rate.  Has seen cardiology.  Calcium score 2019 - zero.  Discussed need for f/u with cardiology.   ?

## 2021-08-30 NOTE — Assessment & Plan Note (Signed)
Increased stress.  Feels handling things relatively well.  Follow.   

## 2021-08-30 NOTE — Assessment & Plan Note (Signed)
Followed by Dr Rolly Salter.  Avoid antiinflammatories.  Stay hydrated.  Check metabolic panel. Last GFR here 01/2021 - 52. Follow.  ?

## 2021-08-30 NOTE — Assessment & Plan Note (Signed)
Has been followed by urology.   

## 2021-08-30 NOTE — Assessment & Plan Note (Signed)
Continues on hydroxyzine 

## 2021-08-30 NOTE — Assessment & Plan Note (Signed)
Increased heart rate as outlined.  Discussed further cardiac w/up (including monitor,etc).  Check routine labs including magnesium.  Overdue labs.  ?

## 2021-08-30 NOTE — Telephone Encounter (Signed)
Needs mammogram scheduled.  Overdue.   ?

## 2021-08-30 NOTE — Assessment & Plan Note (Signed)
Off statin medication.  Check cholesterol.  Low cholesterol diet and exercise.  ?

## 2021-08-31 ENCOUNTER — Other Ambulatory Visit (INDEPENDENT_AMBULATORY_CARE_PROVIDER_SITE_OTHER): Payer: BC Managed Care – PPO

## 2021-08-31 DIAGNOSIS — R000 Tachycardia, unspecified: Secondary | ICD-10-CM

## 2021-08-31 DIAGNOSIS — E7800 Pure hypercholesterolemia, unspecified: Secondary | ICD-10-CM | POA: Diagnosis not present

## 2021-08-31 DIAGNOSIS — N183 Chronic kidney disease, stage 3 unspecified: Secondary | ICD-10-CM | POA: Diagnosis not present

## 2021-08-31 DIAGNOSIS — R739 Hyperglycemia, unspecified: Secondary | ICD-10-CM | POA: Diagnosis not present

## 2021-08-31 LAB — LIPID PANEL
Cholesterol: 218 mg/dL — ABNORMAL HIGH (ref 0–200)
HDL: 56.6 mg/dL (ref >39.00)
LDL Cholesterol: 131 mg/dL — ABNORMAL HIGH (ref 0–99)
NonHDL: 161.76
Total CHOL/HDL Ratio: 4
Triglycerides: 152 mg/dL — ABNORMAL HIGH (ref 0.0–149.0)
VLDL: 30.4 mg/dL (ref 0.0–40.0)

## 2021-08-31 LAB — CBC WITH DIFFERENTIAL/PLATELET
Basophils Absolute: 0.1 10*3/uL (ref 0.0–0.1)
Basophils Relative: 2.5 % (ref 0.0–3.0)
Eosinophils Absolute: 0.3 10*3/uL (ref 0.0–0.7)
Eosinophils Relative: 6.4 % — ABNORMAL HIGH (ref 0.0–5.0)
HCT: 42.1 % (ref 36.0–46.0)
Hemoglobin: 14.8 g/dL (ref 12.0–15.0)
Lymphocytes Relative: 36.7 % (ref 12.0–46.0)
Lymphs Abs: 2 10*3/uL (ref 0.7–4.0)
MCHC: 35 g/dL (ref 30.0–36.0)
MCV: 87.8 fl (ref 78.0–100.0)
Monocytes Absolute: 0.4 10*3/uL (ref 0.1–1.0)
Monocytes Relative: 7.3 % (ref 3.0–12.0)
Neutro Abs: 2.6 10*3/uL (ref 1.4–7.7)
Neutrophils Relative %: 47.1 % (ref 43.0–77.0)
Platelets: 243 10*3/uL (ref 150.0–400.0)
RBC: 4.8 Mil/uL (ref 3.87–5.11)
RDW: 13.1 % (ref 11.5–15.5)
WBC: 5.5 10*3/uL (ref 4.0–10.5)

## 2021-08-31 LAB — HEPATIC FUNCTION PANEL
ALT: 22 U/L (ref 0–35)
AST: 20 U/L (ref 0–37)
Albumin: 4.2 g/dL (ref 3.5–5.2)
Alkaline Phosphatase: 52 U/L (ref 39–117)
Bilirubin, Direct: 0.1 mg/dL (ref 0.0–0.3)
Total Bilirubin: 0.5 mg/dL (ref 0.2–1.2)
Total Protein: 6.9 g/dL (ref 6.0–8.3)

## 2021-08-31 LAB — BASIC METABOLIC PANEL
BUN: 17 mg/dL (ref 6–23)
CO2: 27 mEq/L (ref 19–32)
Calcium: 9 mg/dL (ref 8.4–10.5)
Chloride: 104 mEq/L (ref 96–112)
Creatinine, Ser: 1.06 mg/dL (ref 0.40–1.20)
GFR: 55.44 mL/min — ABNORMAL LOW (ref >60.00)
Glucose, Bld: 90 mg/dL (ref 70–99)
Potassium: 3.4 mEq/L — ABNORMAL LOW (ref 3.5–5.1)
Sodium: 140 mEq/L (ref 135–145)

## 2021-08-31 LAB — TSH: TSH: 6.79 u[IU]/mL — ABNORMAL HIGH (ref 0.35–5.50)

## 2021-08-31 LAB — HEMOGLOBIN A1C: Hgb A1c MFr Bld: 5.6 % (ref 4.6–6.5)

## 2021-08-31 LAB — MAGNESIUM: Magnesium: 2 mg/dL (ref 1.5–2.5)

## 2021-09-01 ENCOUNTER — Other Ambulatory Visit: Payer: Self-pay

## 2021-09-01 DIAGNOSIS — E039 Hypothyroidism, unspecified: Secondary | ICD-10-CM

## 2021-09-06 DIAGNOSIS — R002 Palpitations: Secondary | ICD-10-CM | POA: Diagnosis not present

## 2021-09-07 NOTE — Telephone Encounter (Signed)
Pt does not wish to have me schedule mammo at this time - asked that I call her back in a few weeks, she will be out of town for that time. ?

## 2021-09-22 DIAGNOSIS — R002 Palpitations: Secondary | ICD-10-CM | POA: Diagnosis not present

## 2021-10-13 ENCOUNTER — Other Ambulatory Visit: Payer: BC Managed Care – PPO

## 2021-10-16 ENCOUNTER — Other Ambulatory Visit: Payer: Self-pay | Admitting: Internal Medicine

## 2021-10-16 DIAGNOSIS — R339 Retention of urine, unspecified: Secondary | ICD-10-CM

## 2021-10-20 ENCOUNTER — Other Ambulatory Visit (INDEPENDENT_AMBULATORY_CARE_PROVIDER_SITE_OTHER): Payer: BC Managed Care – PPO

## 2021-10-20 ENCOUNTER — Telehealth: Payer: Self-pay | Admitting: Internal Medicine

## 2021-10-20 DIAGNOSIS — E039 Hypothyroidism, unspecified: Secondary | ICD-10-CM

## 2021-10-20 LAB — TSH: TSH: 9.9 u[IU]/mL — ABNORMAL HIGH (ref 0.35–5.50)

## 2021-10-20 NOTE — Telephone Encounter (Signed)
FYI :: S/w Judith Rios, stated she is getting ready to go out of state until the end of July and unsure of her schedule. Pt stated she prefers to call herself when she knows of a good time for her to go.   Looks like I also tried to schedule for pt in May, and pt stated similar.Marland KitchenMarland Kitchen

## 2021-10-20 NOTE — Telephone Encounter (Signed)
I received notification that she is overdue a mammogram. Order is in.  Needs to be scheduled.

## 2021-10-21 ENCOUNTER — Other Ambulatory Visit: Payer: Self-pay

## 2021-10-21 DIAGNOSIS — R739 Hyperglycemia, unspecified: Secondary | ICD-10-CM

## 2021-10-21 DIAGNOSIS — E785 Hyperlipidemia, unspecified: Secondary | ICD-10-CM

## 2021-10-21 DIAGNOSIS — N183 Chronic kidney disease, stage 3 unspecified: Secondary | ICD-10-CM

## 2021-10-21 DIAGNOSIS — E7800 Pure hypercholesterolemia, unspecified: Secondary | ICD-10-CM

## 2021-10-21 DIAGNOSIS — E039 Hypothyroidism, unspecified: Secondary | ICD-10-CM

## 2021-10-22 ENCOUNTER — Encounter: Payer: Self-pay | Admitting: Internal Medicine

## 2021-10-22 ENCOUNTER — Telehealth: Payer: Self-pay

## 2021-10-22 NOTE — Telephone Encounter (Signed)
LMTCB  for Dr. Bary Leriche response from labs.

## 2021-10-22 NOTE — Telephone Encounter (Signed)
Pt returning call

## 2021-11-01 NOTE — Telephone Encounter (Signed)
See result note.  

## 2021-11-20 ENCOUNTER — Other Ambulatory Visit: Payer: Self-pay | Admitting: Internal Medicine

## 2021-11-24 ENCOUNTER — Other Ambulatory Visit: Payer: Self-pay

## 2021-11-24 ENCOUNTER — Telehealth: Payer: Self-pay | Admitting: Internal Medicine

## 2021-11-24 NOTE — Telephone Encounter (Signed)
S/w pt - last time we had seen her she was currently NOT taking statin. Pt stated re-started it about 3 weeks ago. Crestor '10mg'$  Mon, Wed, Fri. Stated she has come off of it because she thought it was making her hair fall out and giving her cramping in her toes and legs, but pt states has not been happening since she has started re-taking medicine.  Pt would like to start taking QD. Asking for refill.  Ok to fill ?

## 2021-11-24 NOTE — Telephone Encounter (Signed)
Tristen Warehouse manager) called [phone], called requesting a new prescription with updated dosage directions for the patient.  The patient told her that she is taking it daily and that is different from what they have on file.  The medicine is CRESTOR (rosuvastatin).

## 2021-11-24 NOTE — Telephone Encounter (Signed)
Ok to start crestor '10mg'$  q day.  Needs to keep f/u appt

## 2021-11-25 ENCOUNTER — Other Ambulatory Visit: Payer: Self-pay

## 2021-11-25 MED ORDER — ROSUVASTATIN CALCIUM 10 MG PO TABS: 10.0000 mg | ORAL_TABLET | Freq: Every day | ORAL | 3 refills | Status: DC

## 2021-11-25 NOTE — Telephone Encounter (Signed)
sent 

## 2021-12-08 ENCOUNTER — Ambulatory Visit (INDEPENDENT_AMBULATORY_CARE_PROVIDER_SITE_OTHER): Payer: PPO

## 2021-12-08 DIAGNOSIS — R011 Cardiac murmur, unspecified: Secondary | ICD-10-CM

## 2021-12-08 LAB — ECHOCARDIOGRAM COMPLETE
AR max vel: 1.85 cm2
AV Area VTI: 2.04 cm2
AV Area mean vel: 1.87 cm2
AV Mean grad: 3 mmHg
AV Peak grad: 5.7 mmHg
Ao pk vel: 1.19 m/s
Area-P 1/2: 2.36 cm2
Calc EF: 55.9 %
S' Lateral: 2.7 cm
Single Plane A2C EF: 54.2 %
Single Plane A4C EF: 56 %

## 2021-12-13 ENCOUNTER — Telehealth: Payer: Self-pay | Admitting: Internal Medicine

## 2021-12-13 NOTE — Telephone Encounter (Signed)
Collette has scheduled pt for 9/13.  Nothing further needed at this time.

## 2021-12-14 ENCOUNTER — Other Ambulatory Visit (INDEPENDENT_AMBULATORY_CARE_PROVIDER_SITE_OTHER): Payer: PPO

## 2021-12-14 DIAGNOSIS — R739 Hyperglycemia, unspecified: Secondary | ICD-10-CM | POA: Diagnosis not present

## 2021-12-14 DIAGNOSIS — N183 Chronic kidney disease, stage 3 unspecified: Secondary | ICD-10-CM

## 2021-12-14 DIAGNOSIS — E039 Hypothyroidism, unspecified: Secondary | ICD-10-CM | POA: Diagnosis not present

## 2021-12-14 DIAGNOSIS — E785 Hyperlipidemia, unspecified: Secondary | ICD-10-CM

## 2021-12-14 LAB — HEPATIC FUNCTION PANEL
ALT: 23 U/L (ref 0–35)
AST: 20 U/L (ref 0–37)
Albumin: 4.6 g/dL (ref 3.5–5.2)
Alkaline Phosphatase: 56 U/L (ref 39–117)
Bilirubin, Direct: 0.1 mg/dL (ref 0.0–0.3)
Total Bilirubin: 0.9 mg/dL (ref 0.2–1.2)
Total Protein: 7 g/dL (ref 6.0–8.3)

## 2021-12-14 LAB — BASIC METABOLIC PANEL
BUN: 17 mg/dL (ref 6–23)
CO2: 30 mEq/L (ref 19–32)
Calcium: 9.5 mg/dL (ref 8.4–10.5)
Chloride: 98 mEq/L (ref 96–112)
Creatinine, Ser: 1.06 mg/dL (ref 0.40–1.20)
GFR: 55.33 mL/min — ABNORMAL LOW (ref >60.00)
Glucose, Bld: 97 mg/dL (ref 70–99)
Potassium: 3.2 mEq/L — ABNORMAL LOW (ref 3.5–5.1)
Sodium: 139 mEq/L (ref 135–145)

## 2021-12-14 LAB — CBC WITH DIFFERENTIAL/PLATELET
Basophils Absolute: 0.1 10*3/uL (ref 0.0–0.1)
Basophils Relative: 1 % (ref 0.0–3.0)
Eosinophils Absolute: 0.2 10*3/uL (ref 0.0–0.7)
Eosinophils Relative: 2.7 % (ref 0.0–5.0)
HCT: 44.2 % (ref 36.0–46.0)
Hemoglobin: 15.1 g/dL — ABNORMAL HIGH (ref 12.0–15.0)
Lymphocytes Relative: 40.7 % (ref 12.0–46.0)
Lymphs Abs: 2.5 10*3/uL (ref 0.7–4.0)
MCHC: 34.3 g/dL (ref 30.0–36.0)
MCV: 87.4 fl (ref 78.0–100.0)
Monocytes Absolute: 0.5 10*3/uL (ref 0.1–1.0)
Monocytes Relative: 8.3 % (ref 3.0–12.0)
Neutro Abs: 2.9 10*3/uL (ref 1.4–7.7)
Neutrophils Relative %: 47.3 % (ref 43.0–77.0)
Platelets: 241 10*3/uL (ref 150.0–400.0)
RBC: 5.05 Mil/uL (ref 3.87–5.11)
RDW: 13.5 % (ref 11.5–15.5)
WBC: 6.1 10*3/uL (ref 4.0–10.5)

## 2021-12-14 LAB — LIPID PANEL
Cholesterol: 162 mg/dL (ref 0–200)
HDL: 64.3 mg/dL (ref >39.00)
LDL Cholesterol: 70 mg/dL (ref 0–99)
NonHDL: 98.14
Total CHOL/HDL Ratio: 3
Triglycerides: 143 mg/dL (ref 0.0–149.0)
VLDL: 28.6 mg/dL (ref 0.0–40.0)

## 2021-12-14 LAB — HEMOGLOBIN A1C: Hgb A1c MFr Bld: 5.8 % (ref 4.6–6.5)

## 2021-12-14 LAB — TSH: TSH: 8.89 u[IU]/mL — ABNORMAL HIGH (ref 0.35–5.50)

## 2021-12-15 ENCOUNTER — Ambulatory Visit: Payer: BC Managed Care – PPO | Admitting: Internal Medicine

## 2021-12-15 ENCOUNTER — Ambulatory Visit: Payer: PPO | Admitting: Medical
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
## 2021-12-16 ENCOUNTER — Encounter: Payer: Self-pay | Admitting: Internal Medicine

## 2021-12-16 ENCOUNTER — Ambulatory Visit (INDEPENDENT_AMBULATORY_CARE_PROVIDER_SITE_OTHER): Payer: PPO | Admitting: Internal Medicine

## 2021-12-16 DIAGNOSIS — K625 Hemorrhage of anus and rectum: Secondary | ICD-10-CM | POA: Diagnosis not present

## 2021-12-16 DIAGNOSIS — Z0000 Encounter for general adult medical examination without abnormal findings: Secondary | ICD-10-CM | POA: Diagnosis not present

## 2021-12-16 DIAGNOSIS — R079 Chest pain, unspecified: Secondary | ICD-10-CM | POA: Diagnosis not present

## 2021-12-16 DIAGNOSIS — E876 Hypokalemia: Secondary | ICD-10-CM | POA: Diagnosis not present

## 2021-12-16 DIAGNOSIS — E7800 Pure hypercholesterolemia, unspecified: Secondary | ICD-10-CM | POA: Diagnosis not present

## 2021-12-16 DIAGNOSIS — N281 Cyst of kidney, acquired: Secondary | ICD-10-CM | POA: Diagnosis not present

## 2021-12-16 DIAGNOSIS — N1831 Chronic kidney disease, stage 3a: Secondary | ICD-10-CM

## 2021-12-16 DIAGNOSIS — N200 Calculus of kidney: Secondary | ICD-10-CM | POA: Diagnosis not present

## 2021-12-16 DIAGNOSIS — R739 Hyperglycemia, unspecified: Secondary | ICD-10-CM

## 2021-12-16 DIAGNOSIS — G479 Sleep disorder, unspecified: Secondary | ICD-10-CM

## 2021-12-16 DIAGNOSIS — F439 Reaction to severe stress, unspecified: Secondary | ICD-10-CM

## 2021-12-16 DIAGNOSIS — R238 Other skin changes: Secondary | ICD-10-CM

## 2021-12-16 DIAGNOSIS — R7989 Other specified abnormal findings of blood chemistry: Secondary | ICD-10-CM

## 2021-12-16 MED ORDER — LEVOTHYROXINE SODIUM 50 MCG PO TABS: 50.0000 ug | ORAL_TABLET | Freq: Every day | ORAL | 2 refills | Status: DC
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
## 2021-12-17 DIAGNOSIS — N200 Calculus of kidney: Secondary | ICD-10-CM | POA: Diagnosis not present

## 2021-12-17 DIAGNOSIS — R3911 Hesitancy of micturition: Secondary | ICD-10-CM | POA: Diagnosis not present

## 2021-12-17 DIAGNOSIS — D49512 Neoplasm of unspecified behavior of left kidney: Secondary | ICD-10-CM | POA: Diagnosis not present

## 2021-12-19 ENCOUNTER — Encounter: Payer: Self-pay | Admitting: Internal Medicine

## 2021-12-19 DIAGNOSIS — R238 Other skin changes: Secondary | ICD-10-CM | POA: Insufficient documentation

## 2021-12-19 NOTE — Assessment & Plan Note (Signed)
Increased stress.  Feels handling things relatively well.  Follow.   

## 2021-12-19 NOTE — Assessment & Plan Note (Signed)
Low carb diet and exercise.  Follow met b and a1c.  

## 2021-12-19 NOTE — Assessment & Plan Note (Signed)
Crestor.  Low cholesterol diet and exercise.  Follow lipid panel and liver function tests.   Lab Results  Component Value Date   CHOL 162 12/14/2021   HDL 64.30 12/14/2021   LDLCALC 70 12/14/2021   LDLDIRECT 85.0 01/04/2019   TRIG 143.0 12/14/2021   CHOLHDL 3 12/14/2021   

## 2021-12-19 NOTE — Assessment & Plan Note (Addendum)
Described chest tightness (intermittent).   Has seen cardiology.  Calcium score 2019 - zero.  Previous echo - mild MR, EF 55-60%.  Has scheduled f/u in a couple of weeks.  Discussed EKG and further w/up.  Wants to f/u with cardiology.

## 2021-12-19 NOTE — Assessment & Plan Note (Addendum)
Physical today 12/16/21.  S/p hysterectomy.  Mammogram overdue.  Scheduled for 01/13/22.  appt to see GI 9/6.

## 2021-12-19 NOTE — Assessment & Plan Note (Signed)
Persistent elevation.  Start synthroid 45mg q day.  Check tsh in 6-8 weeks.

## 2021-12-19 NOTE — Assessment & Plan Note (Signed)
Request referral to dermatology for further evaluation.

## 2021-12-19 NOTE — Assessment & Plan Note (Signed)
Followed by urology.  Stable cyst.

## 2021-12-19 NOTE — Assessment & Plan Note (Signed)
Followed by Dr Rolly Salter.  Avoid antiinflammatories.  Stay hydrated.  Check metabolic panel. Last GFR here 12/14/21  - 55. Follow.

## 2021-12-19 NOTE — Assessment & Plan Note (Signed)
Followed by urology.   

## 2021-12-19 NOTE — Assessment & Plan Note (Signed)
Recent potassium level slightly decreased.  States has not been taking her potassium as directed.  Has f/u soon with nephrology.  Will need f/u potassium check.

## 2021-12-19 NOTE — Assessment & Plan Note (Signed)
Continues on hydroxyzine

## 2021-12-19 NOTE — Assessment & Plan Note (Signed)
Reports intermittent rectal bleeding as outlined.  Recent hgb 15.1.  Scheduled to see GI 12/29/21.  No active bleeding currently.

## 2021-12-20 ENCOUNTER — Other Ambulatory Visit: Payer: Self-pay | Admitting: Urology

## 2021-12-20 DIAGNOSIS — D49512 Neoplasm of unspecified behavior of left kidney: Secondary | ICD-10-CM

## 2021-12-22 ENCOUNTER — Telehealth: Payer: Self-pay

## 2021-12-22 ENCOUNTER — Other Ambulatory Visit: Payer: Self-pay

## 2021-12-22 ENCOUNTER — Ambulatory Visit: Payer: PPO

## 2021-12-22 DIAGNOSIS — R110 Nausea: Secondary | ICD-10-CM | POA: Diagnosis not present

## 2021-12-22 DIAGNOSIS — Z79899 Other long term (current) drug therapy: Secondary | ICD-10-CM | POA: Diagnosis not present

## 2021-12-22 DIAGNOSIS — K5730 Diverticulosis of large intestine without perforation or abscess without bleeding: Secondary | ICD-10-CM | POA: Diagnosis not present

## 2021-12-22 DIAGNOSIS — Z20822 Contact with and (suspected) exposure to covid-19: Secondary | ICD-10-CM | POA: Diagnosis not present

## 2021-12-22 DIAGNOSIS — K589 Irritable bowel syndrome without diarrhea: Secondary | ICD-10-CM | POA: Diagnosis not present

## 2021-12-22 DIAGNOSIS — R079 Chest pain, unspecified: Secondary | ICD-10-CM | POA: Diagnosis not present

## 2021-12-22 DIAGNOSIS — N3289 Other specified disorders of bladder: Secondary | ICD-10-CM | POA: Diagnosis not present

## 2021-12-22 DIAGNOSIS — K7689 Other specified diseases of liver: Secondary | ICD-10-CM | POA: Diagnosis not present

## 2021-12-22 DIAGNOSIS — Z887 Allergy status to serum and vaccine status: Secondary | ICD-10-CM | POA: Diagnosis not present

## 2021-12-22 DIAGNOSIS — R1013 Epigastric pain: Secondary | ICD-10-CM | POA: Diagnosis not present

## 2021-12-22 DIAGNOSIS — J9811 Atelectasis: Secondary | ICD-10-CM | POA: Diagnosis not present

## 2021-12-22 DIAGNOSIS — N281 Cyst of kidney, acquired: Secondary | ICD-10-CM | POA: Diagnosis not present

## 2021-12-22 DIAGNOSIS — R0789 Other chest pain: Secondary | ICD-10-CM | POA: Diagnosis not present

## 2021-12-22 DIAGNOSIS — K760 Fatty (change of) liver, not elsewhere classified: Secondary | ICD-10-CM | POA: Diagnosis not present

## 2021-12-22 DIAGNOSIS — Z888 Allergy status to other drugs, medicaments and biological substances status: Secondary | ICD-10-CM | POA: Diagnosis not present

## 2021-12-22 DIAGNOSIS — N189 Chronic kidney disease, unspecified: Secondary | ICD-10-CM | POA: Diagnosis not present

## 2021-12-22 DIAGNOSIS — R918 Other nonspecific abnormal finding of lung field: Secondary | ICD-10-CM | POA: Diagnosis not present

## 2021-12-22 NOTE — Telephone Encounter (Signed)
FYI I spoke with patient & she stated she has been using a heating pad that had subsided pain. She knows something is going on but it better at the moment. She was adamantly against the ED & was unable to get her same day appointment here or anything tomorrow. She was willing to go to UC across from [place] as long as I can schedule an appointment for her & I have done so. Pt scheduled at 5p today at [facility].

## 2021-12-22 NOTE — Telephone Encounter (Signed)
Reviewed access nurse note.  Given pt with increased pain, clammy, etc - needs to be seen.  Per note is just wanting an ultrasound scheduled.  Needs to be evaluated to determine definite etiology.  Needs to be seen today.

## 2021-12-22 NOTE — Telephone Encounter (Signed)
Access nurse called back, patient was not on phone. Access nurse stated patient refused to go to the ED.

## 2021-12-22 NOTE — Telephone Encounter (Signed)
Patient states she believes she may be having a gall bladder attack.  Patient states she is experiencing sharp pain in the middle of her chest and under her right rib.  Patient states this started on Monday night (12/20/2021) and it comes and goes.  Patient states she is also feeling nauseated.  Patient states she has had this in the past and Dr. Einar Pheasant ordered an ultrasound.  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    Transferred call to Access Nurse.

## 2021-12-22 NOTE — Telephone Encounter (Signed)
Per access nurse. Pt refused UC or ED evaluation, given she has 2 upcoming meetings one tonight & other all day tomorrow in [place].

## 2021-12-23 DIAGNOSIS — N3289 Other specified disorders of bladder: Secondary | ICD-10-CM | POA: Diagnosis not present

## 2021-12-23 DIAGNOSIS — R079 Chest pain, unspecified: Secondary | ICD-10-CM | POA: Diagnosis not present

## 2021-12-23 DIAGNOSIS — R918 Other nonspecific abnormal finding of lung field: Secondary | ICD-10-CM | POA: Diagnosis not present

## 2021-12-23 DIAGNOSIS — K5730 Diverticulosis of large intestine without perforation or abscess without bleeding: Secondary | ICD-10-CM | POA: Diagnosis not present

## 2021-12-23 DIAGNOSIS — K760 Fatty (change of) liver, not elsewhere classified: Secondary | ICD-10-CM | POA: Diagnosis not present

## 2021-12-23 DIAGNOSIS — J9811 Atelectasis: Secondary | ICD-10-CM | POA: Diagnosis not present

## 2021-12-23 DIAGNOSIS — N281 Cyst of kidney, acquired: Secondary | ICD-10-CM | POA: Diagnosis not present

## 2021-12-29 ENCOUNTER — Ambulatory Visit: Payer: PPO | Admitting: Physician Assistant

## 2022-01-05 ENCOUNTER — Ambulatory Visit: Payer: PPO

## 2022-01-05 DIAGNOSIS — G4733 Obstructive sleep apnea (adult) (pediatric): Secondary | ICD-10-CM

## 2022-01-05 DIAGNOSIS — R0683 Snoring: Secondary | ICD-10-CM

## 2022-01-06 DIAGNOSIS — R1013 Epigastric pain: Secondary | ICD-10-CM | POA: Diagnosis not present

## 2022-01-06 DIAGNOSIS — K769 Liver disease, unspecified: Secondary | ICD-10-CM | POA: Diagnosis not present

## 2022-01-06 DIAGNOSIS — R0789 Other chest pain: Secondary | ICD-10-CM | POA: Diagnosis not present

## 2022-01-06 DIAGNOSIS — K921 Melena: Secondary | ICD-10-CM | POA: Diagnosis not present

## 2022-01-06 DIAGNOSIS — D126 Benign neoplasm of colon, unspecified: Secondary | ICD-10-CM | POA: Diagnosis not present

## 2022-01-06 DIAGNOSIS — K623 Rectal prolapse: Secondary | ICD-10-CM | POA: Diagnosis not present

## 2022-01-07 DIAGNOSIS — G4733 Obstructive sleep apnea (adult) (pediatric): Secondary | ICD-10-CM | POA: Diagnosis not present

## 2022-01-11 ENCOUNTER — Ambulatory Visit
Admission: RE | Admit: 2022-01-11 | Discharge: 2022-01-11 | Disposition: A | Discharge status: Home / Self Care | Payer: PPO | Source: Ambulatory Visit | Attending: Urology | Admitting: Urology

## 2022-01-11 ENCOUNTER — Telehealth: Payer: Self-pay | Admitting: Internal Medicine

## 2022-01-11 DIAGNOSIS — D49512 Neoplasm of unspecified behavior of left kidney: Secondary | ICD-10-CM

## 2022-01-11 DIAGNOSIS — N281 Cyst of kidney, acquired: Secondary | ICD-10-CM | POA: Diagnosis not present

## 2022-01-11 DIAGNOSIS — R0683 Snoring: Secondary | ICD-10-CM

## 2022-01-11 DIAGNOSIS — K760 Fatty (change of) liver, not elsewhere classified: Secondary | ICD-10-CM | POA: Diagnosis not present

## 2022-01-11 MED ORDER — GADOBENATE DIMEGLUMINE 529 MG/ML IV SOLN
13.0000 mL | Freq: Once | INTRAVENOUS | Status: AC | PRN
  Administered 2022-01-11: 13 mL via INTRAVENOUS

## 2022-01-11 NOTE — Telephone Encounter (Signed)
Sleep study consistent with moderate sleep apnea. She needs to start CPAP therapy based on her symptoms. Please prescribe auto PAP 5-15 cm H20 with medium face mask, humidification, tubing and supplies.

## 2022-01-12 DIAGNOSIS — M6289 Other specified disorders of muscle: Secondary | ICD-10-CM | POA: Diagnosis not present

## 2022-01-12 NOTE — Telephone Encounter (Signed)
Spoke with pt and reviewed Dr. Mauricio Po message. Pt stated understanding. C-Pap order placed.

## 2022-01-13 ENCOUNTER — Ambulatory Visit
Admission: RE | Admit: 2022-01-13 | Discharge: 2022-01-13 | Disposition: A | Discharge status: Home / Self Care | Payer: PPO | Source: Ambulatory Visit | Attending: Internal Medicine | Admitting: Internal Medicine

## 2022-01-13 DIAGNOSIS — Z1231 Encounter for screening mammogram for malignant neoplasm of breast: Secondary | ICD-10-CM | POA: Insufficient documentation

## 2022-01-26 DIAGNOSIS — K648 Other hemorrhoids: Secondary | ICD-10-CM | POA: Diagnosis not present

## 2022-01-26 DIAGNOSIS — K921 Melena: Secondary | ICD-10-CM | POA: Diagnosis not present

## 2022-01-26 DIAGNOSIS — K5730 Diverticulosis of large intestine without perforation or abscess without bleeding: Secondary | ICD-10-CM | POA: Diagnosis not present

## 2022-01-26 DIAGNOSIS — R1013 Epigastric pain: Secondary | ICD-10-CM | POA: Diagnosis not present

## 2022-01-28 ENCOUNTER — Other Ambulatory Visit: Payer: PPO

## 2022-04-06 ENCOUNTER — Other Ambulatory Visit (INDEPENDENT_AMBULATORY_CARE_PROVIDER_SITE_OTHER): Payer: PPO

## 2022-04-06 DIAGNOSIS — E876 Hypokalemia: Secondary | ICD-10-CM | POA: Diagnosis not present

## 2022-04-06 DIAGNOSIS — R7989 Other specified abnormal findings of blood chemistry: Secondary | ICD-10-CM

## 2022-04-06 LAB — POTASSIUM: Potassium: 3.2 mEq/L — ABNORMAL LOW (ref 3.5–5.1)

## 2022-04-06 LAB — TSH: TSH: 4.8 u[IU]/mL (ref 0.35–5.50)

## 2022-04-07 ENCOUNTER — Encounter: Payer: Self-pay | Admitting: Family Medicine

## 2022-04-07 ENCOUNTER — Telehealth (INDEPENDENT_AMBULATORY_CARE_PROVIDER_SITE_OTHER): Payer: PPO | Admitting: Family Medicine

## 2022-04-07 VITALS — Ht 60.0 in | Wt 140.6 lb

## 2022-04-07 DIAGNOSIS — J019 Acute sinusitis, unspecified: Secondary | ICD-10-CM

## 2022-04-07 MED ORDER — AMOXICILLIN-POT CLAVULANATE 875-125 MG PO TABS: 1.0000 | ORAL_TABLET | Freq: Two times a day (BID) | ORAL | 0 refills | Status: DC

## 2022-04-07 MED ORDER — BENZONATATE 100 MG PO CAPS: ORAL_CAPSULE | ORAL | 0 refills | Status: DC

## 2022-04-07 NOTE — Patient Instructions (Signed)
-  I sent the medication(s) we discussed to your pharmacy: Meds ordered this encounter  Medications   amoxicillin-clavulanate (AUGMENTIN) 875-125 MG tablet    Sig: Take 1 tablet by mouth 2 (two) times daily.    Dispense:  20 tablet    Refill:  0   benzonatate (TESSALON PERLES) 100 MG capsule    Sig: 1-2 capsules up to twice daily as needed for cough.    Dispense:  30 capsule    Refill:  0     I hope you are feeling better soon!  Seek in person care promptly if your symptoms worsen, new concerns arise or you are not improving with treatment.  It was nice to meet you today. I help [facility] out with telemedicine visits on Tuesdays and Thursdays and am happy to help if you need a virtual follow up visit on those days. Otherwise, if you have any concerns or questions following this visit please schedule a follow up visit with your Primary Care office or seek care at a local urgent care clinic to avoid delays in care. If you are having severe or life threatening symptoms please call 911 and/or go to the nearest emergency room.
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
## 2022-04-13 ENCOUNTER — Encounter: Payer: Self-pay | Admitting: Internal Medicine

## 2022-04-13 ENCOUNTER — Ambulatory Visit (INDEPENDENT_AMBULATORY_CARE_PROVIDER_SITE_OTHER): Payer: PPO | Admitting: Internal Medicine

## 2022-04-13 VITALS — BP 98/62 | HR 94 | Temp 97.7°F | Resp 17 | Ht 60.0 in | Wt 129.0 lb

## 2022-04-13 DIAGNOSIS — R7989 Other specified abnormal findings of blood chemistry: Secondary | ICD-10-CM

## 2022-04-13 DIAGNOSIS — Z860100 Personal history of colon polyps, unspecified: Secondary | ICD-10-CM

## 2022-04-13 DIAGNOSIS — N281 Cyst of kidney, acquired: Secondary | ICD-10-CM | POA: Diagnosis not present

## 2022-04-13 DIAGNOSIS — R051 Acute cough: Secondary | ICD-10-CM | POA: Diagnosis not present

## 2022-04-13 DIAGNOSIS — F439 Reaction to severe stress, unspecified: Secondary | ICD-10-CM | POA: Diagnosis not present

## 2022-04-13 DIAGNOSIS — G4730 Sleep apnea, unspecified: Secondary | ICD-10-CM | POA: Diagnosis not present

## 2022-04-13 DIAGNOSIS — R739 Hyperglycemia, unspecified: Secondary | ICD-10-CM

## 2022-04-13 DIAGNOSIS — K219 Gastro-esophageal reflux disease without esophagitis: Secondary | ICD-10-CM

## 2022-04-13 DIAGNOSIS — N1831 Chronic kidney disease, stage 3a: Secondary | ICD-10-CM | POA: Diagnosis not present

## 2022-04-13 DIAGNOSIS — E7800 Pure hypercholesterolemia, unspecified: Secondary | ICD-10-CM | POA: Diagnosis not present

## 2022-04-13 DIAGNOSIS — E876 Hypokalemia: Secondary | ICD-10-CM | POA: Diagnosis not present
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
## 2022-04-15 LAB — NOVEL CORONAVIRUS, NAA: SARS-CoV-2, NAA: NOT DETECTED

## 2022-04-24 ENCOUNTER — Encounter: Payer: Self-pay | Admitting: Internal Medicine

## 2022-04-24 DIAGNOSIS — G4730 Sleep apnea, unspecified: Secondary | ICD-10-CM | POA: Insufficient documentation

## 2022-04-24 NOTE — Assessment & Plan Note (Signed)
Increased stress.  Feels handling things relatively well.  Follow.

## 2022-04-24 NOTE — Assessment & Plan Note (Signed)
Crestor.  Low cholesterol diet and exercise.  Follow lipid panel and liver function tests.   Lab Results  Component Value Date   CHOL 162 12/14/2021   HDL 64.30 12/14/2021   LDLCALC 70 12/14/2021   LDLDIRECT 85.0 01/04/2019   TRIG 143.0 12/14/2021   CHOLHDL 3 12/14/2021

## 2022-04-24 NOTE — Assessment & Plan Note (Signed)
Recent evaluation as outlined.  Treated with augmentin and tessalon  perles.  Symptoms have improved.  Nasal swab for diagnostic purposes.  Saline /steroid nasal spray.  Follow.  Call if persistent symptoms.

## 2022-04-24 NOTE — Assessment & Plan Note (Signed)
Has been followed by Dr Rolly Salter.  Avoid antiinflammatories.  Stay hydrated.  Check metabolic panel. Follow.

## 2022-04-24 NOTE — Assessment & Plan Note (Signed)
Saw GI.  Recommended protonix and carafate as outlined. EGD as outlined.  Follow.

## 2022-04-24 NOTE — Assessment & Plan Note (Signed)
CPAP.  Saw pulmonary.

## 2022-04-24 NOTE — Assessment & Plan Note (Signed)
Low carb diet and exercise.  Follow met b and a1c.  

## 2022-04-24 NOTE — Assessment & Plan Note (Signed)
[facility] 12/20/21 - Dr Harvie Bridge - recommended f/u MRI.  MRI 02/2022 - No evidence of hepatic or renal neoplasms. Mild diffuse hepatic steatosis. Small benign Bosniak category 1 and 2 renal cysts (no followup imaging is recommended)

## 2022-04-24 NOTE — Assessment & Plan Note (Signed)
Colonoscopy 01/26/22 - mild diverticulosis of the sigmoid colon, internal hemorrhoids, mild rectal prolapse. Recommended f/u colonoscopy in 7 years.

## 2022-04-24 NOTE — Assessment & Plan Note (Signed)
Never started synthroid.  Recent tsh wnl.  Follow.

## 2022-06-24 ENCOUNTER — Other Ambulatory Visit: Payer: Self-pay | Admitting: Internal Medicine

## 2022-07-14 ENCOUNTER — Other Ambulatory Visit: Payer: PPO

## 2022-07-20 ENCOUNTER — Ambulatory Visit (INDEPENDENT_AMBULATORY_CARE_PROVIDER_SITE_OTHER): Payer: PPO

## 2022-07-20 ENCOUNTER — Encounter: Payer: Self-pay | Admitting: Internal Medicine

## 2022-07-20 ENCOUNTER — Ambulatory Visit (INDEPENDENT_AMBULATORY_CARE_PROVIDER_SITE_OTHER): Payer: PPO | Admitting: Internal Medicine

## 2022-07-20 VITALS — BP 102/70 | HR 89 | Temp 98.0°F | Resp 16 | Ht 60.0 in | Wt 126.8 lb

## 2022-07-20 DIAGNOSIS — K219 Gastro-esophageal reflux disease without esophagitis: Secondary | ICD-10-CM | POA: Diagnosis not present

## 2022-07-20 DIAGNOSIS — Z860100 Personal history of colon polyps, unspecified: Secondary | ICD-10-CM

## 2022-07-20 DIAGNOSIS — R739 Hyperglycemia, unspecified: Secondary | ICD-10-CM

## 2022-07-20 DIAGNOSIS — M546 Pain in thoracic spine: Secondary | ICD-10-CM | POA: Diagnosis not present

## 2022-07-20 DIAGNOSIS — N1831 Chronic kidney disease, stage 3a: Secondary | ICD-10-CM | POA: Diagnosis not present

## 2022-07-20 DIAGNOSIS — N281 Cyst of kidney, acquired: Secondary | ICD-10-CM

## 2022-07-20 DIAGNOSIS — R339 Retention of urine, unspecified: Secondary | ICD-10-CM

## 2022-07-20 DIAGNOSIS — Z87442 Personal history of urinary calculi: Secondary | ICD-10-CM | POA: Diagnosis not present

## 2022-07-20 DIAGNOSIS — E876 Hypokalemia: Secondary | ICD-10-CM | POA: Diagnosis not present

## 2022-07-20 DIAGNOSIS — G479 Sleep disorder, unspecified: Secondary | ICD-10-CM

## 2022-07-20 DIAGNOSIS — G4730 Sleep apnea, unspecified: Secondary | ICD-10-CM

## 2022-07-20 DIAGNOSIS — K589 Irritable bowel syndrome without diarrhea: Secondary | ICD-10-CM | POA: Diagnosis not present

## 2022-07-20 DIAGNOSIS — E7800 Pure hypercholesterolemia, unspecified: Secondary | ICD-10-CM | POA: Diagnosis not present

## 2022-07-20 DIAGNOSIS — F439 Reaction to severe stress, unspecified: Secondary | ICD-10-CM

## 2022-07-20 LAB — CBC WITH DIFFERENTIAL/PLATELET
Basophils Absolute: 0 10*3/uL (ref 0.0–0.1)
Basophils Relative: 0.9 % (ref 0.0–3.0)
Eosinophils Absolute: 0.2 10*3/uL (ref 0.0–0.7)
Eosinophils Relative: 4.3 % (ref 0.0–5.0)
HCT: 44 % (ref 36.0–46.0)
Hemoglobin: 15.2 g/dL — ABNORMAL HIGH (ref 12.0–15.0)
Lymphocytes Relative: 34.7 % (ref 12.0–46.0)
Lymphs Abs: 1.5 10*3/uL (ref 0.7–4.0)
MCHC: 34.5 g/dL (ref 30.0–36.0)
MCV: 86.8 fl (ref 78.0–100.0)
Monocytes Absolute: 0.3 10*3/uL (ref 0.1–1.0)
Monocytes Relative: 7.6 % (ref 3.0–12.0)
Neutro Abs: 2.2 10*3/uL (ref 1.4–7.7)
Neutrophils Relative %: 52.5 % (ref 43.0–77.0)
Platelets: 261 10*3/uL (ref 150.0–400.0)
RBC: 5.07 Mil/uL (ref 3.87–5.11)
RDW: 12.7 % (ref 11.5–15.5)
WBC: 4.2 10*3/uL (ref 4.0–10.5)

## 2022-07-20 LAB — HEPATIC FUNCTION PANEL
ALT: 23 U/L (ref 0–35)
AST: 20 U/L (ref 0–37)
Albumin: 4.6 g/dL (ref 3.5–5.2)
Alkaline Phosphatase: 56 U/L (ref 39–117)
Bilirubin, Direct: 0.2 mg/dL (ref 0.0–0.3)
Total Bilirubin: 0.9 mg/dL (ref 0.2–1.2)
Total Protein: 7.1 g/dL (ref 6.0–8.3)

## 2022-07-20 LAB — BASIC METABOLIC PANEL
BUN: 18 mg/dL (ref 6–23)
CO2: 35 mEq/L — ABNORMAL HIGH (ref 19–32)
Calcium: 9.6 mg/dL (ref 8.4–10.5)
Chloride: 98 mEq/L (ref 96–112)
Creatinine, Ser: 1.06 mg/dL (ref 0.40–1.20)
GFR: 55.1 mL/min — ABNORMAL LOW (ref >60.00)
Glucose, Bld: 106 mg/dL — ABNORMAL HIGH (ref 70–99)
Potassium: 3.3 mEq/L — ABNORMAL LOW (ref 3.5–5.1)
Sodium: 141 mEq/L (ref 135–145)

## 2022-07-20 LAB — LIPID PANEL
Cholesterol: 177 mg/dL (ref 0–200)
HDL: 71.3 mg/dL (ref >39.00)
LDL Cholesterol: 86 mg/dL (ref 0–99)
NonHDL: 106.15
Total CHOL/HDL Ratio: 2
Triglycerides: 100 mg/dL (ref 0.0–149.0)
VLDL: 20 mg/dL (ref 0.0–40.0)

## 2022-07-20 LAB — HEMOGLOBIN A1C: Hgb A1c MFr Bld: 5.8 % (ref 4.6–6.5)

## 2022-07-20 MED ORDER — HYDROXYZINE PAMOATE 50 MG PO CAPS: 50.0000 mg | ORAL_CAPSULE | Freq: Every evening | ORAL | 1 refills | Status: DC | PRN

## 2022-07-20 MED ORDER — CILIDINIUM-CHLORDIAZEPOXIDE 2.5-5 MG PO CAPS: ORAL_CAPSULE | ORAL | 0 refills | Status: DC

## 2022-07-20 MED ORDER — TAMSULOSIN HCL 0.4 MG PO CAPS: 0.4000 mg | ORAL_CAPSULE | Freq: Every day | ORAL | 2 refills | Status: DC

## 2022-07-20 MED ORDER — HYDROCHLOROTHIAZIDE 50 MG PO TABS: 50.0000 mg | ORAL_TABLET | Freq: Every day | ORAL | 2 refills | Status: DC
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
## 2022-07-21 ENCOUNTER — Telehealth: Payer: Self-pay

## 2022-07-21 DIAGNOSIS — E876 Hypokalemia: Secondary | ICD-10-CM

## 2022-07-21 NOTE — Telephone Encounter (Signed)
-----   Message from Einar Pheasant, MD sent at 07/21/2022  5:40 AM EDT ----- Please call and notify - back xray reveals some minimal arthritis changes.  Given persistent pain, can refer to PT for evaluation and treatment.  See lab result note.

## 2022-07-21 NOTE — Telephone Encounter (Signed)
-----   Message from Einar Pheasant, MD sent at 07/21/2022  5:38 AM EDT ----- Notify - kidney function stable.  Potassium stable (slightly decreased, but stable).  Confirm continuing to take her potassium supplements.  Please document how she is taking.  Would like to recheck potassium in 2-3 weeks to confirm stable. Overall sugar control stable.  A1c 5.8. cholesterol levels ok.  Liver function tests are wnl.

## 2022-07-21 NOTE — Telephone Encounter (Signed)
LMTCB for lab and xray results

## 2022-07-21 NOTE — Telephone Encounter (Signed)
LMTCB for lab and xray results.

## 2022-07-21 NOTE — Telephone Encounter (Signed)
Pt called and I read the message to her and she want to know about her O2 and she wants the provider to check her thyroids when comes back for her lab appointment. Pt stated she missed a dose of her potassium every now and then and that is the reason for the change

## 2022-07-24 ENCOUNTER — Encounter: Payer: Self-pay | Admitting: Internal Medicine

## 2022-07-24 NOTE — Assessment & Plan Note (Signed)
Continues on hydroxyzine ?

## 2022-07-24 NOTE — Assessment & Plan Note (Signed)
Crestor.  Low cholesterol diet and exercise.  Follow lipid panel and liver function tests.   Lab Results  Component Value Date   CHOL 177 07/20/2022   HDL 71.30 07/20/2022   LDLCALC 86 07/20/2022   LDLDIRECT 85.0 01/04/2019   TRIG 100.0 07/20/2022   CHOLHDL 2 07/20/2022

## 2022-07-24 NOTE — Assessment & Plan Note (Signed)
Saw GI.  Recommended protonix and carafate.  EGD as outlined.  Follow.

## 2022-07-24 NOTE — Assessment & Plan Note (Signed)
Has been evaluated by GI.  Overall stable.

## 2022-07-24 NOTE — Assessment & Plan Note (Signed)
Reports back pain. Given persistent pain, will check thoracic spine xray.  Follow.  Further w/up pending results.

## 2022-07-24 NOTE — Assessment & Plan Note (Signed)
CPAP.  Saw pulmonary.  

## 2022-07-24 NOTE — Assessment & Plan Note (Signed)
Increased stress.  Feels handling things relatively well.  Follow.   

## 2022-07-24 NOTE — Assessment & Plan Note (Signed)
Followed by urology.   

## 2022-07-24 NOTE — Assessment & Plan Note (Signed)
Has been followed by Dr Rolly Salter.  Avoid antiinflammatories.  Stay hydrated.  Follow metabolic panel.

## 2022-07-24 NOTE — Assessment & Plan Note (Signed)
Low carb diet and exercise.  Follow met b and a1c.   

## 2022-07-24 NOTE — Assessment & Plan Note (Signed)
Recent potassium level slightly decreased.  On potassium supplements.  Follow.

## 2022-07-24 NOTE — Assessment & Plan Note (Signed)
Colonoscopy 01/26/22 - mild diverticulosis of the sigmoid colon, internal hemorrhoids, mild rectal prolapse. Recommended f/u colonoscopy in 7 years.   

## 2022-07-24 NOTE — Assessment & Plan Note (Signed)
[facility] 12/20/21 - Dr Lester Bordon - recommended f/u MRI.  MRI 02/2022 - No evidence of hepatic or renal neoplasms. Mild diffuse hepatic steatosis. Small benign Bosniak category 1 and 2 renal cysts (no followup imaging is recommended) 

## 2022-07-25 NOTE — Telephone Encounter (Signed)
LMTCB

## 2022-07-28 NOTE — Telephone Encounter (Signed)
She is scheduled for potassium labs to be redrawn.

## 2022-07-28 NOTE — Telephone Encounter (Signed)
Patient takes 20 ml atleast 3 times a day but sometimes misses doses.

## 2022-07-28 NOTE — Addendum Note (Signed)
Addended by: Roetta Sessions D on: 07/28/2022 01:30 PM   Modules accepted: Orders

## 2022-08-04 ENCOUNTER — Ambulatory Visit: Payer: PPO | Admitting: Dermatology

## 2022-08-04 VITALS — BP 92/66

## 2022-08-04 DIAGNOSIS — L689 Hypertrichosis, unspecified: Secondary | ICD-10-CM | POA: Diagnosis not present

## 2022-08-04 DIAGNOSIS — Z79899 Other long term (current) drug therapy: Secondary | ICD-10-CM

## 2022-08-04 DIAGNOSIS — D2239 Melanocytic nevi of other parts of face: Secondary | ICD-10-CM

## 2022-08-04 DIAGNOSIS — L578 Other skin changes due to chronic exposure to nonionizing radiation: Secondary | ICD-10-CM | POA: Diagnosis not present

## 2022-08-04 DIAGNOSIS — Z1283 Encounter for screening for malignant neoplasm of skin: Secondary | ICD-10-CM | POA: Diagnosis not present

## 2022-08-04 DIAGNOSIS — L820 Inflamed seborrheic keratosis: Secondary | ICD-10-CM

## 2022-08-04 DIAGNOSIS — Z808 Family history of malignant neoplasm of other organs or systems: Secondary | ICD-10-CM

## 2022-08-04 DIAGNOSIS — L821 Other seborrheic keratosis: Secondary | ICD-10-CM | POA: Diagnosis not present

## 2022-08-04 DIAGNOSIS — Z7189 Other specified counseling: Secondary | ICD-10-CM | POA: Diagnosis not present

## 2022-08-04 DIAGNOSIS — L219 Seborrheic dermatitis, unspecified: Secondary | ICD-10-CM | POA: Diagnosis not present

## 2022-08-04 MED ORDER — KETOCONAZOLE 2 % EX SHAM: 1.0000 | MEDICATED_SHAMPOO | CUTANEOUS | 11 refills | Status: DC

## 2022-08-04 NOTE — Patient Instructions (Addendum)
Alastin Body cream followed by Cerave cream for crepey skin  studies have show Voltaren gel bid for 2-3 months can improve but recommend LN2  Due to recent changes in healthcare laws, you may see results of your pathology and/or laboratory studies on MyChart before the doctors have had a chance to review them. We understand that in some cases there may be results that are confusing or concerning to you. Please understand that not all results are received at the same time and often the doctors may need to interpret multiple results in order to provide you with the best plan of care or course of treatment. Therefore, we ask that you please give [place] 2 business days to thoroughly review all your results before contacting the office for clarification. Should we see a critical lab result, you will be contacted sooner.   If You Need Anything After Your Visit  If you have any questions or concerns for your doctor, please call our main line at [phone] and press option 4 to reach your doctor's medical assistant. If no one answers, please leave a voicemail as directed and we will return your call as soon as possible. Messages left after 4 pm will be answered the following business day.   You may also send [place] a message via MyChart. We typically respond to MyChart messages within 1-2 business days.  For prescription refills, please ask your pharmacy to contact our office. Our fax number is [phone].  If you have an urgent issue when the clinic is closed that cannot wait until the next business day, you can page your doctor at the number below.    Please note that while we do our best to be available for urgent issues outside of office hours, we are not available 24/7.   If you have an urgent issue and are unable to reach [place], you may choose to seek medical care at your doctor's office, retail clinic, urgent care center, or emergency room.  If you have a medical emergency, please immediately call 911  or go to the emergency department.  Pager Numbers  - Dr. Gwen Pounds: [phone]  - Dr. Neale Burly: [phone]  - Dr. Roseanne Reno: [phone]  In the event of inclement weather, please call our main line at [phone] for an update on the status of any delays or closures.  Dermatology Medication Tips: Please keep the boxes that topical medications come in in order to help keep track of the instructions about where and how to use these. Pharmacies typically print the medication instructions only on the boxes and not directly on the medication tubes.   If your medication is too expensive, please contact our office at [phone] option 4 or send [place] a message through MyChart.   We are unable to tell what your co-pay for medications will be in advance as this is different depending on your insurance coverage. However, we may be able to find a substitute medication at lower cost or fill out paperwork to get insurance to cover a needed medication.   If a prior authorization is required to get your medication covered by your insurance company, please allow [place] 1-2 business days to complete this process.  Drug prices often vary depending on where the prescription is filled and some pharmacies may offer cheaper prices.  The website www.goodrx.com contains coupons for medications through different pharmacies. The prices here do not account for what the cost may be with help from insurance (it may be cheaper with your insurance), but  the website can give you the price if you did not use any insurance.  - You can print the associated coupon and take it with your prescription to the pharmacy.  - You may also stop by our office during regular business hours and pick up a GoodRx coupon card.  - If you need your prescription sent electronically to a different pharmacy, notify our office through [facility] or by phone at [phone] option 4.     Si Usted Necesita Algo Despus de Su  Visita  Tambin puede enviarnos un mensaje a travs de Pharmacist, community. Por lo general respondemos a los mensajes de MyChart en el transcurso de 1 a 2 das hbiles.  Para renovar recetas, por favor pida a su farmacia que se ponga en contacto con nuestra oficina. Harland Dingwall de fax es [place] [phone].  Si tiene un asunto urgente cuando la clnica est cerrada y que no puede esperar hasta el siguiente da hbil, puede llamar/localizar a su doctor(a) al nmero que aparece a continuacin.   Por favor, tenga en cuenta que aunque hacemos todo lo posible para estar disponibles para asuntos urgentes fuera del horario de [place], no estamos disponibles las 24 horas del da, los 7 das de la [place].   Si tiene un problema urgente y no puede comunicarse con nosotros, puede optar por buscar atencin mdica  en el consultorio de su doctor(a), en una clnica privada, en un centro de atencin urgente o en una sala de emergencias.  Si tiene Engineering geologist, por favor llame inmediatamente al 911 o vaya a la sala de emergencias.  Nmeros de bper  - Dr. Nehemiah Massed: [phone]  - Dra. Moye: [phone]  - Dra. Nicole Kindred: [phone]  En caso de inclemencias del [place], por favor llame a Johnsie Kindred principal al [phone] para una actualizacin sobre el [place] de cualquier retraso o cierre.  Consejos para la medicacin en dermatologa: Por favor, guarde las cajas en las que vienen los medicamentos de uso tpico para ayudarle a seguir las instrucciones sobre dnde y cmo usarlos. Las farmacias generalmente imprimen las instrucciones del medicamento slo en las cajas y no directamente en los tubos del [place].   Si su medicamento es muy caro, por favor, pngase en contacto con Zigmund Daniel llamando al [phone] y presione la opcin 4 o envenos un mensaje a travs de Pharmacist, community.   No podemos decirle cul ser su copago por los medicamentos por adelantado ya que esto es diferente dependiendo de la  cobertura de su seguro. Sin embargo, es posible que podamos encontrar un medicamento sustituto a Electrical engineer un formulario para que el seguro cubra el medicamento que se considera necesario.   Si se requiere una autorizacin previa para que su compaa de seguros [place] su medicamento, por favor permtanos de 1 a 2 das hbiles para completar este proceso.  Los precios de los medicamentos varan con frecuencia dependiendo del Environmental consultant de dnde se surte la receta y alguna farmacias pueden ofrecer precios ms baratos.  El sitio web www.goodrx.com tiene cupones para medicamentos de Airline pilot. Los precios aqu no tienen en cuenta lo que podra costar con la ayuda del seguro (puede ser ms barato con su seguro), pero el sitio web puede darle el precio si no utiliz Research scientist (physical sciences).  - Puede imprimir el cupn correspondiente y llevarlo con su receta a la farmacia.  - Tambin puede pasar por nuestra oficina durante el horario de atencin regular y Charity fundraiser una tarjeta de cupones de  GoodRx.  - Si necesita que su receta se enve electrnicamente a una farmacia diferente, informe a nuestra oficina a travs de MyChart de [place] o por telfono llamando al [phone] y presione la opcin 4.

## 2022-08-04 NOTE — Progress Notes (Signed)
New Patient Visit   Subjective  Judith Rios is a 67 y.o. female who presents for the following: Skin Cancer Screening and Full Body Skin Exam, check scalp, has had scale and thinning for years, using Nizoral otc shampoo, Head and Shoulders, check spots face, father with possible hx of melanoma, mother with hx of SCC, [facility]  New patient referral from Dr. Dale [place].  The patient presents for Total-Body Skin Exam (TBSE) for skin cancer screening and mole check. The patient has spots, moles and lesions to be evaluated, some may be new or changing and the patient has concerns that these could be cancer.  The following portions of the chart were reviewed this encounter and updated as appropriate: medications, allergies, medical history  Review of Systems:  No other skin or systemic complaints except as noted in HPI or Assessment and Plan.  Objective  Well appearing patient in no apparent distress; mood and affect are within normal limits.  A full examination was performed including scalp, head, eyes, ears, nose, lips, neck, chest, axillae, abdomen, back, buttocks, bilateral upper extremities, bilateral lower extremities, hands, feet, fingers, toes, fingernails, and toenails. All findings within normal limits unless otherwise noted below.   Relevant physical exam findings are noted in the Assessment and Plan.  L nose      Assessment & Plan   LENTIGINES, SEBORRHEIC KERATOSES, HEMANGIOMAS - Benign normal skin lesions - Benign-appearing - Call for any changes  MELANOCYTIC NEVI - Tan-brown and/or pink-flesh-colored symmetric macules and papules - Benign appearing on exam today - Observation - Call clinic for new or changing moles - Recommend daily use of broad spectrum spf 30+ sunscreen to sun-exposed areas.   ACTINIC DAMAGE - Chronic condition, secondary to cumulative UV/sun exposure - diffuse scaly erythematous macules with underlying dyspigmentation - Recommend daily  broad spectrum sunscreen SPF 30+ to sun-exposed areas, reapply every 2 hours as needed.  - Staying in the shade or wearing long sleeves, sun glasses (UVA+UVB protection) and wide brim hats (4-inch brim around the entire circumference of the hat) are also recommended for sun protection.  - Call for new or changing lesions.  SKIN CANCER SCREENING PERFORMED TODAY.  FAMILY HISTORY OF SKIN CANCER What type(s): Melanoma, BCC, SCC Who affected: Father, Mother   SEBORRHEIC DERMATITIS / SEBOPSORIASIS Possible fhx of psoriasis Exam: Pinkness scalp Seborrheic Dermatitis is a chronic persistent rash characterized by pinkness and scaling most commonly of the mid face but also can occur on the scalp (dandruff), ears; mid chest, mid back and groin.  It tends to be exacerbated by stress and cooler weather.  People who have neurologic disease may experience new onset or exacerbation of existing seborrheic dermatitis.  The condition is not curable but treatable and can be controlled.  Treatment Plan: Start Ketoconazole 2% shampoo 3x/wk, let sit 5 minutes and rinse out  Cont Head and Shoulders shampoo alternating with Ketoconazole 2% shampoo  INFLAMED SEBORRHEIC KERATOSIS Exam: Erythematous keratotic or waxy stuck-on papule or plaque face  Symptomatic, irritating, patient would like treated at a later date, due to growing. Dr. Neale Burly discussed studies have show Voltaren gel bid for 2-3 months can improve but recommend LN2  HYPERTRICHOSIS Exam: chin, upper lip with hypertrichosis Treatment Plan: Discussed LHR   FACIAL ELASTOSIS Exam: Rhytides and volume loss neck Treatment Plan: Discussed SkinTyte, information given Discussed to expect improvement but not full correction, recommend 3 treatments for best results and then 1 maintenance treatment q 3-25m to maintain results.  Recommend daily  broad spectrum sunscreen SPF 30+ to sun-exposed areas, reapply every 2 hours as needed. Call for new or changing  lesions.  Staying in the shade or wearing long sleeves, sun glasses (UVA+UVB protection) and wide brim hats (4-inch brim around the entire circumference of the hat) are also recommended for sun protection.    Angiofibroma/Fibrous Papule L nose Exam: 2-35mm flesh pap See photo Treatment: Benign Appearing, observe for changes  Return for to be scheduled for ISK treatment, Skin Tyte with Dr. Neale Burly .  I, Ardis Rowan, RMA, am acting as scribe for Armida Sans, MD .  Documentation: I have reviewed the above documentation for accuracy and completeness, and I agree with the above.  Armida Sans, MD

## 2022-08-09 ENCOUNTER — Encounter: Payer: Self-pay | Admitting: Dermatology

## 2022-08-09 ENCOUNTER — Ambulatory Visit (INDEPENDENT_AMBULATORY_CARE_PROVIDER_SITE_OTHER): Payer: Self-pay | Admitting: Dermatology

## 2022-08-09 VITALS — BP 92/66

## 2022-08-09 DIAGNOSIS — L988 Other specified disorders of the skin and subcutaneous tissue: Secondary | ICD-10-CM

## 2022-08-09 NOTE — Patient Instructions (Signed)
Due to recent changes in healthcare laws, you may see results of your pathology and/or laboratory studies on MyChart before the doctors have had a chance to review them. We understand that in some cases there may be results that are confusing or concerning to you. Please understand that not all results are received at the same time and often the doctors may need to interpret multiple results in order to provide you with the best plan of care or course of treatment. Therefore, we ask that you please give us 2 business days to thoroughly review all your results before contacting the office for clarification. Should we see a critical lab result, you will be contacted sooner.   If You Need Anything After Your Visit  If you have any questions or concerns for your doctor, please call our main line at [phone] and press option 4 to reach your doctor's medical assistant. If no one answers, please leave a voicemail as directed and we will return your call as soon as possible. Messages left after 4 pm will be answered the following business day.   You may also send us a message via MyChart. We typically respond to MyChart messages within 1-2 business days.  For prescription refills, please ask your pharmacy to contact our office. Our fax number is [phone].  If you have an urgent issue when the clinic is closed that cannot wait until the next business day, you can page your doctor at the number below.    Please note that while we do our best to be available for urgent issues outside of office hours, we are not available 24/7.   If you have an urgent issue and are unable to reach us, you may choose to seek medical care at your doctor's office, retail clinic, urgent care center, or emergency room.  If you have a medical emergency, please immediately call 911 or go to the emergency department.  Pager Numbers  - Dr. Kowalski: [phone]  - Dr. Moye: [phone]  - Dr. Stewart:  [phone]  In the event of inclement weather, please call our main line at [phone] for an update on the status of any delays or closures.  Dermatology Medication Tips: Please keep the boxes that topical medications come in in order to help keep track of the instructions about where and how to use these. Pharmacies typically print the medication instructions only on the boxes and not directly on the medication tubes.   If your medication is too expensive, please contact our office at [phone] option 4 or send us a message through MyChart.   We are unable to tell what your co-pay for medications will be in advance as this is different depending on your insurance coverage. However, we may be able to find a substitute medication at lower cost or fill out paperwork to get insurance to cover a needed medication.   If a prior authorization is required to get your medication covered by your insurance company, please allow us 1-2 business days to complete this process.  Drug prices often vary depending on where the prescription is filled and some pharmacies may offer cheaper prices.  The website www.goodrx.com contains coupons for medications through different pharmacies. The prices here do not account for what the cost may be with help from insurance (it may be cheaper with your insurance), but the website can give you the price if you did not use any insurance.  - You can print the associated coupon and take it with   your prescription to the pharmacy.  - You may also stop by our office during regular business hours and pick up a GoodRx coupon card.  - If you need your prescription sent electronically to a different pharmacy, notify our office through [facility] MyChart or by phone at [phone] option 4.     Si Usted Necesita Algo Despus de Su Visita  Tambin puede enviarnos un mensaje a travs de MyChart. Por lo general respondemos a los mensajes de MyChart en el transcurso de 1 a 2  das hbiles.  Para renovar recetas, por favor pida a su farmacia que se ponga en contacto con nuestra oficina. Nuestro nmero de fax es el [phone].  Si tiene un asunto urgente cuando la clnica est cerrada y que no puede esperar hasta el siguiente da hbil, puede llamar/localizar a su doctor(a) al nmero que aparece a continuacin.   Por favor, tenga en cuenta que aunque hacemos todo lo posible para estar disponibles para asuntos urgentes fuera del horario de oficina, no estamos disponibles las 24 horas del da, los 7 das de la semana.   Si tiene un problema urgente y no puede comunicarse con nosotros, puede optar por buscar atencin mdica  en el consultorio de su doctor(a), en una clnica privada, en un centro de atencin urgente o en una sala de emergencias.  Si tiene una emergencia mdica, por favor llame inmediatamente al 911 o vaya a la sala de emergencias.  Nmeros de bper  - Dr. Kowalski: [phone]  - Dra. Moye: [phone]  - Dra. Stewart: [phone]  En caso de inclemencias del tiempo, por favor llame a nuestra lnea principal al [phone] para una actualizacin sobre el estado de cualquier retraso o cierre.  Consejos para la medicacin en dermatologa: Por favor, guarde las cajas en las que vienen los medicamentos de uso tpico para ayudarle a seguir las instrucciones sobre dnde y cmo usarlos. Las farmacias generalmente imprimen las instrucciones del medicamento slo en las cajas y no directamente en los tubos del medicamento.   Si su medicamento es muy caro, por favor, pngase en contacto con nuestra oficina llamando al [phone] y presione la opcin 4 o envenos un mensaje a travs de MyChart.   No podemos decirle cul ser su copago por los medicamentos por adelantado ya que esto es diferente dependiendo de la cobertura de su seguro. Sin embargo, es posible que podamos encontrar un medicamento sustituto a menor costo o llenar un formulario para que el  seguro cubra el medicamento que se considera necesario.   Si se requiere una autorizacin previa para que su compaa de seguros cubra su medicamento, por favor permtanos de 1 a 2 das hbiles para completar este proceso.  Los precios de los medicamentos varan con frecuencia dependiendo del lugar de dnde se surte la receta y alguna farmacias pueden ofrecer precios ms baratos.  El sitio web www.goodrx.com tiene cupones para medicamentos de diferentes farmacias. Los precios aqu no tienen en cuenta lo que podra costar con la ayuda del seguro (puede ser ms barato con su seguro), pero el sitio web puede darle el precio si no utiliz ningn seguro.  - Puede imprimir el cupn correspondiente y llevarlo con su receta a la farmacia.  - Tambin puede pasar por nuestra oficina durante el horario de atencin regular y recoger una tarjeta de cupones de GoodRx.  - Si necesita que su receta se enve electrnicamente a una farmacia diferente, informe a nuestra oficina a travs de MyChart de [place]   o por telfono llamando al [phone] y presione la opcin 4.  

## 2022-08-09 NOTE — Progress Notes (Signed)
   Follow-Up Visit   Subjective  Judith Rios is a 68 y.o. female who presents for the following: SkinTyte Laser   The following portions of the chart were reviewed this encounter and updated as appropriate: medications, allergies, medical history  Review of Systems:  No other skin or systemic complaints except as noted in HPI or Assessment and Plan.  Objective  Well appearing patient in no apparent distress; mood and affect are within normal limits.   A focused examination was performed of the following areas: Neck, face, chest  Relevant exam findings are noted in the Assessment and Plan.  Neck Rhytides and volume loss.          LEFT NECK   RIGHT NECK   RIGHT LOWER NECK   LEFT LOWER NECK   Assessment & Plan     Elastosis of skin Neck  Patient was given a Post Laser Treatment Kit  Prior to the procedure, the patient's past medical history, medications, allergies, and the rare but potential risks and complications were reviewed with the patient and a signed consent was obtained.  Pre and post treatment care was discussed and instructions provided.    Skintyte Motion - 08/09/22 1530      Treatment Details   Photos (Yes/No): Yes    Skin Type: II    ST Filter: 590    Body Location: neck  Full Crystal or Square Adapter: See photos above  Intensity W/cm2: see photos above    Time in Seconds: 12 seconds    Time on Tissue at Desired Temp: 5 minutes for upper neck, 4 minutes for lower neck  External Temp A: see photos above  Observational Comments: see photos above      Patient tolerated the procedure well.   Wynelle Link avoidance was stressed and SPF applied accordingly was discussed.   Return for [facility] as scheduled.  Anise Salvo, RMA, am acting as scribe for Darden Dates, MD .   Documentation: I have reviewed the above documentation for accuracy and completeness, and I agree with the above.  Darden Dates, MD

## 2022-08-18 ENCOUNTER — Other Ambulatory Visit (INDEPENDENT_AMBULATORY_CARE_PROVIDER_SITE_OTHER): Payer: PPO

## 2022-08-18 DIAGNOSIS — E876 Hypokalemia: Secondary | ICD-10-CM

## 2022-08-18 LAB — POTASSIUM: Potassium: 3.7 mEq/L (ref 3.5–5.1)

## 2022-08-21 ENCOUNTER — Encounter: Payer: Self-pay | Admitting: Dermatology

## 2022-09-01 ENCOUNTER — Ambulatory Visit (INDEPENDENT_AMBULATORY_CARE_PROVIDER_SITE_OTHER): Payer: PPO | Admitting: Dermatology

## 2022-09-01 DIAGNOSIS — L988 Other specified disorders of the skin and subcutaneous tissue: Secondary | ICD-10-CM

## 2022-09-01 NOTE — Progress Notes (Signed)
   Follow-Up Visit   Subjective  Judith Rios is a 67 y.o. female who presents for the following: SkinTyte laser for elastosis of the neck, treatment #2.   The following portions of the chart were reviewed this encounter and updated as appropriate: medications, allergies, medical history  Review of Systems:  No other skin or systemic complaints except as noted in HPI or Assessment and Plan.  Objective  Well appearing patient in no apparent distress; mood and affect are within normal limits.  A focused examination was performed of the following areas: the face and neck  Relevant exam findings are noted in the Assessment and Plan.   Assessment & Plan        ELASTOSIS OF SKIN  Skintyte Photorejuvenation Prior to the procedure, the patient's past medical history, medications, allergies, and the rare but potential risks and complications were reviewed with the patient and a signed consent was obtained.  Pre and post treatment care was discussed and instructions provided. Eye shields were in place for the duration of treatment.  Skintyte Motion  Treatment Details  Photos (Yes/No): yes Skin Type: II ST Filter: 590   Body Location: Neck Full Crystal or Square Adapter: See photos above Intensity W/cm2: See photos above   Time in Seconds: 12 seconds   Time on Tissue at Desired Temp: Per protocol (4 minutes for each area of neck) External Temp: Target temperature 40-42 degrees reached  Patient tolerated the procedure well.   Wynelle Link avoidance was stressed. The patient will call with any problems, questions or concerns prior to their next appointment.   Return for appointment as scheduled.  Maylene Roes, CMA, am acting as scribe for Darden Dates, MD .  Documentation: I have reviewed the above documentation for accuracy and completeness, and I agree with the above.  Darden Dates, MD

## 2022-09-01 NOTE — Patient Instructions (Signed)
Due to recent changes in healthcare laws, you may see results of your pathology and/or laboratory studies on MyChart before the doctors have had a chance to review them. We understand that in some cases there may be results that are confusing or concerning to you. Please understand that not all results are received at the same time and often the doctors may need to interpret multiple results in order to provide you with the best plan of care or course of treatment. Therefore, we ask that you please give us 2 business days to thoroughly review all your results before contacting the office for clarification. Should we see a critical lab result, you will be contacted sooner.   If You Need Anything After Your Visit  If you have any questions or concerns for your doctor, please call our main line at [phone] and press option 4 to reach your doctor's medical assistant. If no one answers, please leave a voicemail as directed and we will return your call as soon as possible. Messages left after 4 pm will be answered the following business day.   You may also send us a message via MyChart. We typically respond to MyChart messages within 1-2 business days.  For prescription refills, please ask your pharmacy to contact our office. Our fax number is [phone].  If you have an urgent issue when the clinic is closed that cannot wait until the next business day, you can page your doctor at the number below.    Please note that while we do our best to be available for urgent issues outside of office hours, we are not available 24/7.   If you have an urgent issue and are unable to reach us, you may choose to seek medical care at your doctor's office, retail clinic, urgent care center, or emergency room.  If you have a medical emergency, please immediately call 911 or go to the emergency department.  Pager Numbers  - Dr. Kowalski: [phone]  - Dr. Moye: [phone]  - Dr. Stewart:  [phone]  In the event of inclement weather, please call our main line at [phone] for an update on the status of any delays or closures.  Dermatology Medication Tips: Please keep the boxes that topical medications come in in order to help keep track of the instructions about where and how to use these. Pharmacies typically print the medication instructions only on the boxes and not directly on the medication tubes.   If your medication is too expensive, please contact our office at [phone] option 4 or send us a message through MyChart.   We are unable to tell what your co-pay for medications will be in advance as this is different depending on your insurance coverage. However, we may be able to find a substitute medication at lower cost or fill out paperwork to get insurance to cover a needed medication.   If a prior authorization is required to get your medication covered by your insurance company, please allow us 1-2 business days to complete this process.  Drug prices often vary depending on where the prescription is filled and some pharmacies may offer cheaper prices.  The website www.goodrx.com contains coupons for medications through different pharmacies. The prices here do not account for what the cost may be with help from insurance (it may be cheaper with your insurance), but the website can give you the price if you did not use any insurance.  - You can print the associated coupon and take it with   your prescription to the pharmacy.  - You may also stop by our office during regular business hours and pick up a GoodRx coupon card.  - If you need your prescription sent electronically to a different pharmacy, notify our office through [facility] MyChart or by phone at [phone] option 4.     Si Usted Necesita Algo Despus de Su Visita  Tambin puede enviarnos un mensaje a travs de MyChart. Por lo general respondemos a los mensajes de MyChart en el transcurso de 1 a 2  das hbiles.  Para renovar recetas, por favor pida a su farmacia que se ponga en contacto con nuestra oficina. Nuestro nmero de fax es el [phone].  Si tiene un asunto urgente cuando la clnica est cerrada y que no puede esperar hasta el siguiente da hbil, puede llamar/localizar a su doctor(a) al nmero que aparece a continuacin.   Por favor, tenga en cuenta que aunque hacemos todo lo posible para estar disponibles para asuntos urgentes fuera del horario de oficina, no estamos disponibles las 24 horas del da, los 7 das de la semana.   Si tiene un problema urgente y no puede comunicarse con nosotros, puede optar por buscar atencin mdica  en el consultorio de su doctor(a), en una clnica privada, en un centro de atencin urgente o en una sala de emergencias.  Si tiene una emergencia mdica, por favor llame inmediatamente al 911 o vaya a la sala de emergencias.  Nmeros de bper  - Dr. Kowalski: [phone]  - Dra. Moye: [phone]  - Dra. Stewart: [phone]  En caso de inclemencias del tiempo, por favor llame a nuestra lnea principal al [phone] para una actualizacin sobre el estado de cualquier retraso o cierre.  Consejos para la medicacin en dermatologa: Por favor, guarde las cajas en las que vienen los medicamentos de uso tpico para ayudarle a seguir las instrucciones sobre dnde y cmo usarlos. Las farmacias generalmente imprimen las instrucciones del medicamento slo en las cajas y no directamente en los tubos del medicamento.   Si su medicamento es muy caro, por favor, pngase en contacto con nuestra oficina llamando al [phone] y presione la opcin 4 o envenos un mensaje a travs de MyChart.   No podemos decirle cul ser su copago por los medicamentos por adelantado ya que esto es diferente dependiendo de la cobertura de su seguro. Sin embargo, es posible que podamos encontrar un medicamento sustituto a menor costo o llenar un formulario para que el  seguro cubra el medicamento que se considera necesario.   Si se requiere una autorizacin previa para que su compaa de seguros cubra su medicamento, por favor permtanos de 1 a 2 das hbiles para completar este proceso.  Los precios de los medicamentos varan con frecuencia dependiendo del lugar de dnde se surte la receta y alguna farmacias pueden ofrecer precios ms baratos.  El sitio web www.goodrx.com tiene cupones para medicamentos de diferentes farmacias. Los precios aqu no tienen en cuenta lo que podra costar con la ayuda del seguro (puede ser ms barato con su seguro), pero el sitio web puede darle el precio si no utiliz ningn seguro.  - Puede imprimir el cupn correspondiente y llevarlo con su receta a la farmacia.  - Tambin puede pasar por nuestra oficina durante el horario de atencin regular y recoger una tarjeta de cupones de GoodRx.  - Si necesita que su receta se enve electrnicamente a una farmacia diferente, informe a nuestra oficina a travs de MyChart de    o por telfono llamando al [phone] y presione la opcin 4.  

## 2022-09-22 ENCOUNTER — Ambulatory Visit (INDEPENDENT_AMBULATORY_CARE_PROVIDER_SITE_OTHER): Payer: PPO | Admitting: Dermatology

## 2022-09-22 ENCOUNTER — Telehealth: Payer: Self-pay | Admitting: Dermatology

## 2022-09-22 ENCOUNTER — Encounter: Payer: Self-pay | Admitting: Dermatology

## 2022-09-22 DIAGNOSIS — L988 Other specified disorders of the skin and subcutaneous tissue: Secondary | ICD-10-CM

## 2022-09-22 NOTE — Telephone Encounter (Signed)
Patient called for Tretinoin Rx:  Will prescribe [facility] Anti-Aging Tretinoin 0.025%/Niacinamide/Vitamin C/Vitamin E/Turmeric/Resveratrol with Hyaluronic Acid. Apply pea sized amount nightly to the entire face.  The patient was advised this is not covered by insurance since it is made by a compounding pharmacy. They will receive an email to check out and the medication will be mailed to their home.   Topical retinoid medications like tretinoin can cause dryness and irritation when first started. Only apply a pea-sized amount to the entire affected area. Avoid applying it around the eyes, edges of mouth and creases at the nose. If you experience irritation, use a good moisturizer first and/or apply the medicine less often. If you are doing well with the medicine, you can increase how often you use it until you are applying every night. Be careful with sun protection while using this medication as it can make you sensitive to the sun. This medicine should not be used by pregnant women.

## 2022-09-22 NOTE — Progress Notes (Signed)
   Follow-Up Visit   Subjective  Judith Rios is a 67 y.o. female who presents for the following: SkinTyte laser for elastosis of the neck, treatment #3.  She has some concerns that rhytides at the lower neck appear more pronounced.  The following portions of the chart were reviewed this encounter and updated as appropriate: medications, allergies, medical history  Review of Systems:  No other skin or systemic complaints except as noted in HPI or Assessment and Plan.  Objective  Well appearing patient in no apparent distress; mood and affect are within normal limits.  A focused examination was performed of the following areas: the face and neck  Relevant exam findings are noted in the Assessment and Plan.          Left Neck    Right Neck   Assessment & Plan    ELASTOSIS OF SKIN  Skintyte Photorejuvenation - NO CHARGE TRAINING CASE, NEW TECHNIQUE NOT TRIED BEFORE  Discussed at length the appearance of rhytides at lower neck. Overall skin texture appears improved and laxity at the upper neck appears improved compared to previous photos. However, it does appear that lower neck deeper lines appear more pronounced. This may be due to "redraping" of the skin at the lower neck as the skin at the upper center neck, which receives the strongest treatment getting some overlap from each side, contracts more than surrounding areas.   Advised I cannot promise continued Skintyte treatments could not make this worse. Discussed options including discontinuing treatments, skin care (tretinoin, Alastin Neck Complex, and Neostrata Triple Firming Neck Cream), or doing a trial of treatment of skintyte focusing on only the lateral neck and low neck, avoiding treatment of the upper center neck, with a goal of causing tightening at the lateral neck to help "redrape" the lower neck skin and building collagen at the lower neck skin to help with wrinkles. Will do this as a training case (NO  CHARGE TRAINING CASE) since this is a new technique I have never done before and I am unsure whether it will be successful. She opted to start skin care and try Skintyte only to the lateral neck and low neck today.  Prior to the procedure, the patient's past medical history, medications, allergies, and the rare but potential risks and complications were reviewed with the patient and a signed consent was obtained.  Pre and post treatment care was discussed and instructions provided. Eye shields were in place for the duration of treatment.  Skintyte Motion  Treatment Details  Photos (Yes/No): yes Skin Type: II ST Filter: 590   Body Location: Neck Full Crystal or Rios Adapter: See photos above Intensity W/cm2: See photos above   Time in Seconds: 12 seconds   Time on Tissue at Desired Temp: Per protocol (4 minutes for each area of neck) External Temp: Target temperature 40-42 degrees reached  Patient tolerated the procedure well.   Wynelle Link avoidance was stressed. The patient will call with any problems, questions or concerns prior to their next appointment.   Return for Follow up as scheduled with Dr. Gwen Pounds.  Maylene Roes, CMA, am acting as scribe for Darden Dates, MD .  Documentation: I have reviewed the above documentation for accuracy and completeness, and I agree with the above.  Darden Dates, MD

## 2022-09-22 NOTE — Patient Instructions (Addendum)
Recommend using [facility] Fragile Skin for Body areas.  Can order Neostrata Triple Firming Neck Cream.    Start out 2-3 nights per week, gradually increase as tolerated.  Will prescribe [facility] Anti-Aging Tretinoin 0.025%/Niacinamide/Vitamin C/Vitamin E/Turmeric/Resveratrol with Hyaluronic Acid. Apply pea sized amount nightly to the entire face.  The patient was advised this is not covered by insurance since it is made by a compounding pharmacy. They will receive an email to check out and the medication will be mailed to their home.   Topical retinoid medications like tretinoin can cause dryness and irritation when first started. Only apply a pea-sized amount to the entire affected area. Avoid applying it around the eyes, edges of mouth and creases at the nose. If you experience irritation, use a good moisturizer first and/or apply the medicine less often. If you are doing well with the medicine, you can increase how often you use it until you are applying every night. Be careful with sun protection while using this medication as it can make you sensitive to the sun. This medicine should not be used by pregnant women.    Instructions for [facility] Medications  One or more of your medications was sent to the [facility] mail order compounding pharmacy. You will receive an email from them and can purchase the medicine through that link. It will then be mailed to your home at the address you confirmed. If for any reason you do not receive an email from them, please check your spam folder. If you still do not find the email, please let us know. [facility] phone number is [phone].    Recommend daily broad spectrum sunscreen SPF 30+ to sun-exposed areas, reapply every 2 hours as needed. Call for new or changing lesions.  Staying in the shade or wearing long sleeves, sun glasses (UVA+UVB protection) and wide brim hats (4-inch brim around the entire circumference of the hat) are  also recommended for sun protection.    Due to recent changes in healthcare laws, you may see results of your pathology and/or laboratory studies on MyChart before the doctors have had a chance to review them. We understand that in some cases there may be results that are confusing or concerning to you. Please understand that not all results are received at the same time and often the doctors may need to interpret multiple results in order to provide you with the best plan of care or course of treatment. Therefore, we ask that you please give [place] 2 business days to thoroughly review all your results before contacting the office for clarification. Should we see a critical lab result, you will be contacted sooner.   If You Need Anything After Your Visit  If you have any questions or concerns for your doctor, please call our main line at [phone] and press option 4 to reach your doctor's medical assistant. If no one answers, please leave a voicemail as directed and we will return your call as soon as possible. Messages left after 4 pm will be answered the following business day.   You may also send [place] a message via MyChart. We typically respond to MyChart messages within 1-2 business days.  For prescription refills, please ask your pharmacy to contact our office. Our fax number is [phone].  If you have an urgent issue when the clinic is closed that cannot wait until the next business day, you can page your doctor at the number below.    Please note that while  we do our best to be available for urgent issues outside of office hours, we are not available 24/7.   If you have an urgent issue and are unable to reach [place], you may choose to seek medical care at your doctor's office, retail clinic, urgent care center, or emergency room.  If you have a medical emergency, please immediately call 911 or go to the emergency department.  Pager Numbers  - Dr. Gwen Pounds: [phone]  - Dr. Neale Burly:  [phone]  - Dr. Roseanne Reno: [phone]  In the event of inclement weather, please call our main line at [phone] for an update on the status of any delays or closures.  Dermatology Medication Tips: Please keep the boxes that topical medications come in in order to help keep track of the instructions about where and how to use these. Pharmacies typically print the medication instructions only on the boxes and not directly on the medication tubes.   If your medication is too expensive, please contact our office at [phone] option 4 or send [place] a message through MyChart.   We are unable to tell what your co-pay for medications will be in advance as this is different depending on your insurance coverage. However, we may be able to find a substitute medication at lower cost or fill out paperwork to get insurance to cover a needed medication.   If a prior authorization is required to get your medication covered by your insurance company, please allow [place] 1-2 business days to complete this process.  Drug prices often vary depending on where the prescription is filled and some pharmacies may offer cheaper prices.  The website www.goodrx.com contains coupons for medications through different pharmacies. The prices here do not account for what the cost may be with help from insurance (it may be cheaper with your insurance), but the website can give you the price if you did not use any insurance.  - You can print the associated coupon and take it with your prescription to the pharmacy.  - You may also stop by our office during regular business hours and pick up a GoodRx coupon card.  - If you need your prescription sent electronically to a different pharmacy, notify our office through [facility] or by phone at [phone] option 4.     Si Usted Necesita Algo Despus de Su Visita  Tambin puede enviarnos un mensaje a travs de Clinical cytogeneticist. Por lo general respondemos a los mensajes de  MyChart en el transcurso de 1 a 2 das hbiles.  Para renovar recetas, por favor pida a su farmacia que se ponga en contacto con nuestra oficina. Annie Sable de fax es [place] [phone].  Si tiene un asunto urgente cuando la clnica est cerrada y que no puede esperar hasta el siguiente da hbil, puede llamar/localizar a su doctor(a) al nmero que aparece a continuacin.   Por favor, tenga en cuenta que aunque hacemos todo lo posible para estar disponibles para asuntos urgentes fuera del horario de [place], no estamos disponibles las 24 horas del da, los 7 [address]  [address] de la [place].   Si tiene un problema urgente y no puede comunicarse con nosotros, puede optar por buscar atencin mdica  en el consultorio de su doctor(a), en una clnica privada, en un centro de atencin urgente o en una sala de emergencias.  Si tiene Engineer, drilling, por favor llame inmediatamente al 911 o vaya a la sala de emergencias.  Nmeros de bper  - Dr. Gwen Pounds: [phone]  - Dra.  Tamala Manzer: [phone]  - Dra. Roseanne Reno: [phone]  En caso de inclemencias del [place], por favor llame a Lacy Duverney principal al [phone] para una actualizacin sobre el [place] de cualquier retraso o cierre.  Consejos para la medicacin en dermatologa: Por favor, guarde las cajas en las que vienen los medicamentos de uso tpico para ayudarle a seguir las instrucciones sobre dnde y cmo usarlos. Las farmacias generalmente imprimen las instrucciones del medicamento slo en las cajas y no directamente en los tubos del [place].   Si su medicamento es muy caro, por favor, pngase en contacto con Rolm Gala llamando al [phone] y presione la opcin 4 o envenos un mensaje a travs de Clinical cytogeneticist.   No podemos decirle cul ser su copago por los medicamentos por adelantado ya que esto es diferente dependiendo de la cobertura de su seguro. Sin embargo, es posible que podamos encontrar un medicamento sustituto a Insurance account manager un formulario para que el seguro cubra el medicamento que se considera necesario.   Si se requiere una autorizacin previa para que su compaa de seguros [place] su medicamento, por favor permtanos de 1 a 2 das hbiles para completar [address]  Los precios de los medicamentos varan con frecuencia dependiendo del Environmental consultant de dnde se surte la receta y alguna farmacias pueden ofrecer precios ms baratos.  El sitio web www.goodrx.com tiene cupones para medicamentos de Health and safety inspector. Los precios aqu no tienen en cuenta lo que podra costar con la ayuda del seguro (puede ser ms barato con su seguro), pero el sitio web puede darle el precio si no utiliz Tourist information centre manager.  - Puede imprimir el cupn correspondiente y llevarlo con su receta a la farmacia.  - Tambin puede pasar por nuestra oficina durante el horario de atencin regular y Education officer, museum una tarjeta de cupones de GoodRx.  - Si necesita que su receta se enve electrnicamente a una farmacia diferente, informe a nuestra oficina a travs de MyChart de [place] o por telfono llamando al [phone] y presione la opcin 4.

## 2022-09-23 ENCOUNTER — Encounter: Payer: Self-pay | Admitting: Dermatology

## 2022-11-08 ENCOUNTER — Other Ambulatory Visit: Payer: Self-pay | Admitting: Internal Medicine

## 2022-11-08 ENCOUNTER — Telehealth: Payer: Self-pay

## 2022-11-08 NOTE — Telephone Encounter (Signed)
She saw you in 06/2022. No f/u appt scheduled. I will schedule her for an appointment. Ok to fill?

## 2022-11-08 NOTE — Telephone Encounter (Signed)
Prescription Request  11/08/2022  LOV: Visit date not found  What is the name of the medication or equipment? clidinium-chlordiazePOXIDE (LIBRAX) 5-2.5 MG capsule  Have you contacted your pharmacy to request a refill? Yes   Which pharmacy would you like this sent to?  [facility] on [address] (not in [employer])    Patient notified that their request is being sent to the clinical staff for review and that they should receive a response within 2 business days.   Please advise at Mobile [phone] (mobile)  Patient states this is not her new pharmacy, they just happen to have this medication available, so she would like for [place] to send it there.  Patient states she would like for [place] to please change quantity from 90-day to 100-day supply so her insurance will pay for it.  Patient states she is out of this medication.

## 2022-11-09 MED ORDER — CILIDINIUM-CHLORDIAZEPOXIDE 2.5-5 MG PO CAPS: ORAL_CAPSULE | ORAL | 0 refills | Status: DC

## 2022-11-09 NOTE — Telephone Encounter (Signed)
Patient was unable to do an in office appt prior to her going out of the country in [place]. Scheduled for a virtual follow up.

## 2022-11-09 NOTE — Telephone Encounter (Signed)
Rx ok'd for librax.  Does need f/u appt.

## 2022-11-16 ENCOUNTER — Ambulatory Visit: Payer: PPO | Admitting: Dermatology

## 2022-11-17 ENCOUNTER — Telehealth (INDEPENDENT_AMBULATORY_CARE_PROVIDER_SITE_OTHER): Payer: PPO | Admitting: Internal Medicine

## 2022-11-17 ENCOUNTER — Encounter: Payer: Self-pay | Admitting: Internal Medicine

## 2022-11-17 VITALS — Ht 60.0 in | Wt 126.0 lb

## 2022-11-17 DIAGNOSIS — R739 Hyperglycemia, unspecified: Secondary | ICD-10-CM

## 2022-11-17 DIAGNOSIS — K219 Gastro-esophageal reflux disease without esophagitis: Secondary | ICD-10-CM

## 2022-11-17 DIAGNOSIS — F439 Reaction to severe stress, unspecified: Secondary | ICD-10-CM | POA: Diagnosis not present

## 2022-11-17 DIAGNOSIS — N1831 Chronic kidney disease, stage 3a: Secondary | ICD-10-CM | POA: Diagnosis not present

## 2022-11-17 DIAGNOSIS — E7800 Pure hypercholesterolemia, unspecified: Secondary | ICD-10-CM | POA: Diagnosis not present

## 2022-11-17 DIAGNOSIS — N281 Cyst of kidney, acquired: Secondary | ICD-10-CM

## 2022-11-17 DIAGNOSIS — G4730 Sleep apnea, unspecified: Secondary | ICD-10-CM | POA: Diagnosis not present

## 2022-11-17 DIAGNOSIS — Z87442 Personal history of urinary calculi: Secondary | ICD-10-CM | POA: Diagnosis not present

## 2022-11-17 DIAGNOSIS — Z86010 Personal history of colonic polyps: Secondary | ICD-10-CM

## 2022-11-17 DIAGNOSIS — K589 Irritable bowel syndrome without diarrhea: Secondary | ICD-10-CM | POA: Diagnosis not present
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
## 2022-11-19 ENCOUNTER — Encounter: Payer: Self-pay | Admitting: Internal Medicine

## 2022-11-19 NOTE — Assessment & Plan Note (Signed)
Colonoscopy 01/26/22 - mild diverticulosis of the sigmoid colon, internal hemorrhoids, mild rectal prolapse. Recommended f/u colonoscopy in 7 years.   

## 2022-11-19 NOTE — Assessment & Plan Note (Signed)
Low carb diet and exercise.  Follow met b and a1c.   

## 2022-11-19 NOTE — Assessment & Plan Note (Signed)
[facility] 12/20/21 - Dr Lester Bordon - recommended f/u MRI.  MRI 02/2022 - No evidence of hepatic or renal neoplasms. Mild diffuse hepatic steatosis. Small benign Bosniak category 1 and 2 renal cysts (no followup imaging is recommended) 

## 2022-11-19 NOTE — Assessment & Plan Note (Signed)
Crestor.  Low cholesterol diet and exercise.  Follow lipid panel and liver function tests.   Lab Results  Component Value Date   CHOL 177 07/20/2022   HDL 71.30 07/20/2022   LDLCALC 86 07/20/2022   LDLDIRECT 85.0 01/04/2019   TRIG 100.0 07/20/2022   CHOLHDL 2 07/20/2022

## 2022-11-19 NOTE — Assessment & Plan Note (Signed)
CPAP.  Saw pulmonary.  

## 2022-11-19 NOTE — Assessment & Plan Note (Signed)
Followed by urology.   

## 2022-11-19 NOTE — Assessment & Plan Note (Signed)
Saw GI.  Recommended protonix and carafate.  Upper symptoms controlled. Follow.

## 2022-11-19 NOTE — Assessment & Plan Note (Signed)
Has been evaluated by GI.  Overall stable.

## 2022-11-19 NOTE — Assessment & Plan Note (Signed)
Has been followed by Dr Rolly Salter.  Avoid antiinflammatories.  Stay hydrated.  Follow metabolic panel.

## 2022-11-19 NOTE — Assessment & Plan Note (Signed)
Increased stress.  Feels handling things relatively well.  Follow.

## 2022-11-21 ENCOUNTER — Telehealth: Payer: Self-pay | Admitting: Internal Medicine

## 2022-11-21 ENCOUNTER — Telehealth: Payer: Self-pay

## 2022-11-21 NOTE — Telephone Encounter (Signed)
Dr. Dale Wacissa would like for [place] to schedule patient for a physical in three months and fasting labs in the next 1-2 weeks.  I left a voicemail for patient asking her to please call us back to schedule these appointments.

## 2022-11-21 NOTE — Telephone Encounter (Signed)
Lvm for pt to scheduled her physical in 4 months and fasting labs within 1-2weeks after her MyChart visit on 11/17/22

## 2022-12-05 ENCOUNTER — Telehealth: Payer: Self-pay

## 2022-12-05 ENCOUNTER — Other Ambulatory Visit (INDEPENDENT_AMBULATORY_CARE_PROVIDER_SITE_OTHER): Payer: PPO

## 2022-12-05 DIAGNOSIS — N1831 Chronic kidney disease, stage 3a: Secondary | ICD-10-CM

## 2022-12-05 DIAGNOSIS — E7800 Pure hypercholesterolemia, unspecified: Secondary | ICD-10-CM

## 2022-12-05 DIAGNOSIS — R739 Hyperglycemia, unspecified: Secondary | ICD-10-CM

## 2022-12-05 LAB — CBC WITH DIFFERENTIAL/PLATELET
Basophils Absolute: 0 10*3/uL (ref 0.0–0.1)
Basophils Relative: 0.8 % (ref 0.0–3.0)
Eosinophils Absolute: 0.1 10*3/uL (ref 0.0–0.7)
Eosinophils Relative: 1.7 % (ref 0.0–5.0)
HCT: 42.8 % (ref 36.0–46.0)
Hemoglobin: 14.7 g/dL (ref 12.0–15.0)
Lymphocytes Relative: 36.5 % (ref 12.0–46.0)
Lymphs Abs: 2 10*3/uL (ref 0.7–4.0)
MCHC: 34.4 g/dL (ref 30.0–36.0)
MCV: 86.2 fl (ref 78.0–100.0)
Monocytes Absolute: 0.5 10*3/uL (ref 0.1–1.0)
Monocytes Relative: 9.3 % (ref 3.0–12.0)
Neutro Abs: 2.8 10*3/uL (ref 1.4–7.7)
Neutrophils Relative %: 51.7 % (ref 43.0–77.0)
Platelets: 253 10*3/uL (ref 150.0–400.0)
RBC: 4.96 Mil/uL (ref 3.87–5.11)
RDW: 12.6 % (ref 11.5–15.5)
WBC: 5.4 10*3/uL (ref 4.0–10.5)

## 2022-12-05 LAB — BASIC METABOLIC PANEL
BUN: 21 mg/dL (ref 6–23)
CO2: 31 mEq/L (ref 19–32)
Calcium: 9.7 mg/dL (ref 8.4–10.5)
Chloride: 96 mEq/L (ref 96–112)
Creatinine, Ser: 1.06 mg/dL (ref 0.40–1.20)
GFR: 54.96 mL/min — ABNORMAL LOW (ref >60.00)
Glucose, Bld: 105 mg/dL — ABNORMAL HIGH (ref 70–99)
Potassium: 2.7 mEq/L — CL (ref 3.5–5.1)
Sodium: 137 mEq/L (ref 135–145)

## 2022-12-05 LAB — LIPID PANEL
Cholesterol: 176 mg/dL (ref 0–200)
HDL: 68.7 mg/dL (ref >39.00)
LDL Cholesterol: 88 mg/dL (ref 0–99)
NonHDL: 106.83
Total CHOL/HDL Ratio: 3
Triglycerides: 92 mg/dL (ref 0.0–149.0)
VLDL: 18.4 mg/dL (ref 0.0–40.0)

## 2022-12-05 LAB — HEPATIC FUNCTION PANEL
ALT: 21 U/L (ref 0–35)
AST: 21 U/L (ref 0–37)
Albumin: 4.6 g/dL (ref 3.5–5.2)
Alkaline Phosphatase: 48 U/L (ref 39–117)
Bilirubin, Direct: 0.2 mg/dL (ref 0.0–0.3)
Total Bilirubin: 0.9 mg/dL (ref 0.2–1.2)
Total Protein: 7.2 g/dL (ref 6.0–8.3)

## 2022-12-05 LAB — HEMOGLOBIN A1C: Hgb A1c MFr Bld: 5.6 % (ref 4.6–6.5)

## 2022-12-05 LAB — TSH: TSH: 4.01 u[IU]/mL (ref 0.35–5.50)

## 2022-12-05 NOTE — Telephone Encounter (Signed)
Need to confirm how much potassium she is taking and how taking - will need to adjust.

## 2022-12-05 NOTE — Telephone Encounter (Signed)
Patient has not taken any potassium in 3 days. Her son in law passed away. She is going to start back on her prescribed dose. Refused to have rechecked because she is flying out early in the morning to go to [place] for 2 weeks. Confirmed doing ok. Pt stated she will call to follow up when she gets back in the country.

## 2022-12-05 NOTE — Telephone Encounter (Signed)
CRITICAL VALUE: Potassium 2.7  RECEIVER (on-site recipient of call): Tresa Endo Fiscal  DATE & TIME NOTIFIED: 12/05/22 3:25p  MESSENGER (representative from lab):  MD NOTIFIED: Dr. Lorin Picket  TIME OF NOTIFICATION: 3:34p  RESPONSE:

## 2022-12-19 DIAGNOSIS — H53042 Amblyopia suspect, left eye: Secondary | ICD-10-CM | POA: Diagnosis not present

## 2022-12-19 DIAGNOSIS — H25013 Cortical age-related cataract, bilateral: Secondary | ICD-10-CM | POA: Diagnosis not present

## 2022-12-19 DIAGNOSIS — H5213 Myopia, bilateral: Secondary | ICD-10-CM | POA: Diagnosis not present

## 2022-12-23 DIAGNOSIS — N200 Calculus of kidney: Secondary | ICD-10-CM | POA: Diagnosis not present

## 2022-12-23 DIAGNOSIS — R3911 Hesitancy of micturition: Secondary | ICD-10-CM | POA: Diagnosis not present

## 2022-12-27 ENCOUNTER — Encounter: Payer: Self-pay | Admitting: Internal Medicine

## 2022-12-27 DIAGNOSIS — N320 Bladder-neck obstruction: Secondary | ICD-10-CM | POA: Insufficient documentation

## 2023-01-01 ENCOUNTER — Other Ambulatory Visit: Payer: Self-pay | Admitting: Internal Medicine

## 2023-01-11 ENCOUNTER — Ambulatory Visit (INDEPENDENT_AMBULATORY_CARE_PROVIDER_SITE_OTHER): Payer: PPO | Admitting: *Deleted

## 2023-01-11 VITALS — Ht 60.0 in | Wt 126.0 lb

## 2023-01-11 DIAGNOSIS — Z1231 Encounter for screening mammogram for malignant neoplasm of breast: Secondary | ICD-10-CM

## 2023-01-11 DIAGNOSIS — Z780 Asymptomatic menopausal state: Secondary | ICD-10-CM | POA: Diagnosis not present

## 2023-01-11 DIAGNOSIS — Z0000 Encounter for general adult medical examination without abnormal findings: Secondary | ICD-10-CM | POA: Diagnosis not present

## 2023-01-11 NOTE — Patient Instructions (Signed)
Judith Rios , Thank you for taking time to come for your Medicare Wellness Visit. I appreciate your ongoing commitment to your health goals. Please review the following plan we discussed and let me know if I can assist you in the future.   Referrals/Orders/Follow-Ups/Clinician Recommendations: You have an order for: Remember to get updated vaccines as discussed. []   2D Mammogram  [x]   3D Mammogram  [x]   Bone Density     Please call for appointment:  [facility]  [address] [place] [address] [phone]    Make sure to wear two-piece clothing.  No lotions, powders, or deodorants the day of the appointment. Make sure to bring picture ID and insurance card.  Bring list of medications you are currently taking including any supplements.   Schedule your [place] screening mammogram through MyChart!   Log into your MyChart account.  Go to 'Visit' (or 'Appointments' if on mobile App) --> Schedule an Appointment  Under 'Select a Reason for Visit' choose the Mammogram Screening option.  Complete the pre-visit questions and select the time and place that best fits your schedule.    This is a list of the screening recommended for you and due dates:  Health Maintenance  Topic Date Due   Zoster (Shingles) Vaccine (1 of 2) Never done   COVID-19 Vaccine (3 - Pfizer risk series) 02/18/2020   Pneumonia Vaccine (1 of 1 - PCV) 04/14/2023*   Mammogram  01/14/2023   Medicare Annual Wellness Visit  01/11/2024   Colon Cancer Screening  01/26/2029   DEXA scan (bone density measurement)  Completed   Hepatitis C Screening  Completed   HPV Vaccine  Aged Out   DTaP/Tdap/Td vaccine  Discontinued   Flu Shot  Discontinued  *Topic was postponed. The date shown is not the original due date.    Advanced directives: (Copy Requested) Please bring a copy of your health care power of attorney and living will to the office to be added to your  chart at your convenience.  Next Medicare Annual Wellness Visit scheduled for next year: Yes 01/22/24 @ 3:15
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
## 2023-01-18 NOTE — Addendum Note (Signed)
Addended by: Sydell Axon C on: 01/18/2023 03:02 PM   Modules accepted: Orders

## 2023-02-17 ENCOUNTER — Encounter: Payer: Self-pay | Admitting: Pharmacist

## 2023-02-17 NOTE — Progress Notes (Signed)
Pharmacy Quality Measure Review  This patient is appearing on a report for being at risk of failing the adherence measure for cholesterol (statin) medications this calendar year.   Medication: rosuvastatin Last fill date: 09/30/22 for 90 day supply  Insurance report was not up to date. No action needed at this time.  Patient has refilled this medication as of 01/03/23 for a 90 day supply.   Loree Fee, PharmD Clinical Pharmacist [facility] [phone]

## 2023-04-10 ENCOUNTER — Other Ambulatory Visit: Payer: Self-pay | Admitting: Internal Medicine

## 2023-04-14 ENCOUNTER — Other Ambulatory Visit: Payer: Self-pay | Admitting: Internal Medicine

## 2023-04-14 ENCOUNTER — Encounter: Payer: Self-pay | Admitting: Internal Medicine

## 2023-04-14 ENCOUNTER — Other Ambulatory Visit: Payer: Self-pay

## 2023-04-14 MED ORDER — HYDROXYZINE PAMOATE 50 MG PO CAPS: 50.0000 mg | ORAL_CAPSULE | Freq: Every evening | ORAL | 0 refills | Status: DC | PRN

## 2023-04-14 MED ORDER — CELECOXIB 200 MG PO CAPS: 200.0000 mg | ORAL_CAPSULE | Freq: Every day | ORAL | 0 refills | Status: DC

## 2023-04-14 MED ORDER — HYDROCHLOROTHIAZIDE 50 MG PO TABS: 50.0000 mg | ORAL_TABLET | Freq: Every day | ORAL | 0 refills | Status: DC

## 2023-04-17 MED ORDER — CILIDINIUM-CHLORDIAZEPOXIDE 2.5-5 MG PO CAPS: ORAL_CAPSULE | ORAL | 0 refills | Status: DC

## 2023-07-07 DIAGNOSIS — E663 Overweight: Secondary | ICD-10-CM | POA: Diagnosis not present

## 2023-07-07 DIAGNOSIS — M545 Low back pain, unspecified: Secondary | ICD-10-CM | POA: Diagnosis not present

## 2023-07-14 ENCOUNTER — Other Ambulatory Visit: Payer: Self-pay | Admitting: Internal Medicine

## 2023-09-03 ENCOUNTER — Other Ambulatory Visit: Payer: Self-pay | Admitting: Internal Medicine

## 2023-09-03 DIAGNOSIS — R339 Retention of urine, unspecified: Secondary | ICD-10-CM

## 2023-09-13 ENCOUNTER — Encounter: Payer: Self-pay | Admitting: Internal Medicine

## 2023-09-15 ENCOUNTER — Telehealth (INDEPENDENT_AMBULATORY_CARE_PROVIDER_SITE_OTHER): Admitting: Internal Medicine

## 2023-09-15 VITALS — BP 100/58 | HR 79 | Ht 60.0 in | Wt 128.0 lb

## 2023-09-15 DIAGNOSIS — J019 Acute sinusitis, unspecified: Secondary | ICD-10-CM

## 2023-09-15 MED ORDER — BENZONATATE 100 MG PO CAPS: 100.0000 mg | ORAL_CAPSULE | Freq: Three times a day (TID) | ORAL | 0 refills | Status: DC | PRN

## 2023-09-15 MED ORDER — TRIAMCINOLONE ACETONIDE 55 MCG/ACT NA AERO: 2.0000 | INHALATION_SPRAY | Freq: Every day | NASAL | 0 refills | Status: AC

## 2023-09-15 MED ORDER — AMOXICILLIN-POT CLAVULANATE 875-125 MG PO TABS
1.0000 | ORAL_TABLET | Freq: Two times a day (BID) | ORAL | 0 refills | Status: DC
Start: 2023-09-15 — End: 2023-09-22
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
## 2023-09-15 NOTE — Assessment & Plan Note (Signed)
 Symptoms as outlined. With increased head and chest congestion. Symptoms progressing.  Concern regarding sinus infection/URI. Continue saline nasal spray. Add nasacort nasal spray - 2 sprays each nostril one time per day. Do this in the evening. Augmentin  875mg  bid. Tessalon  perles as directed. Follow.  Call with update.

## 2023-09-22 ENCOUNTER — Other Ambulatory Visit: Payer: Self-pay

## 2023-09-22 ENCOUNTER — Telehealth: Payer: Self-pay

## 2023-09-22 MED ORDER — FLUCONAZOLE 150 MG PO TABS: ORAL_TABLET | ORAL | 0 refills | Status: DC

## 2023-09-22 MED ORDER — HYDROXYZINE PAMOATE 50 MG PO CAPS: 50.0000 mg | ORAL_CAPSULE | Freq: Every evening | ORAL | 0 refills | Status: DC | PRN

## 2023-09-22 MED ORDER — BENZONATATE 100 MG PO CAPS: 100.0000 mg | ORAL_CAPSULE | Freq: Three times a day (TID) | ORAL | 0 refills | Status: DC | PRN

## 2023-09-22 MED ORDER — HYDROCHLOROTHIAZIDE 50 MG PO TABS: 50.0000 mg | ORAL_TABLET | Freq: Every day | ORAL | 0 refills | Status: DC

## 2023-09-22 MED ORDER — PREDNISONE 10 MG PO TABS: ORAL_TABLET | ORAL | 0 refills | Status: DC

## 2023-09-22 MED ORDER — CELECOXIB 200 MG PO CAPS: 200.0000 mg | ORAL_CAPSULE | Freq: Every day | ORAL | 0 refills | Status: DC

## 2023-09-22 MED ORDER — AMOXICILLIN-POT CLAVULANATE 875-125 MG PO TABS: 1.0000 | ORAL_TABLET | Freq: Two times a day (BID) | ORAL | 0 refills | Status: DC

## 2023-09-22 NOTE — Telephone Encounter (Signed)
 Pt is aware that medications have been sent in. Pt stated that she has taken prednisone  before with no problem.

## 2023-09-22 NOTE — Telephone Encounter (Signed)
 I sent in extension on the antibiotic. Also sent in a prednisone  taper. Please confirm with her that she can take prednisone . Sent in refill for tessalon  perles and also diflucan . She does need to take a probiotic daily while on the antibiotic and for two weeks after completing the antibiotic.

## 2023-09-22 NOTE — Telephone Encounter (Signed)
 Spoke with pt and she stated that last Friday she was prescribed antibiotics and she completed them this morning. Pt stated that she is still coughing a lot and it is worse at night when she lays down. The cough is not as productive as it was the phlegm is not green anymore. She is just concerned with the cough and the hoarseness. She stated that during the appt she was advised that if the antibiotic didn't knock it out then she may need a prednisone  taper. Pt stated that she also needs a refill on the the benzonatate  and would like diflucan  called in because she feels like she is getting a yeast infection, she is having vaginal itching.

## 2023-09-22 NOTE — Telephone Encounter (Signed)
 Copied from CRM [id number]. Topic: Clinical - Medication Question >> Sep 22, 2023  9:41 AM DeAngela L wrote: Reason for CRM: Patient calling after she is done with the antibiotics prescribed, and she would like to ask if Dr Geralyn Knee could call her in another round of anti biotics or whatever she thinks she needs to get better, patient doesn't feel 100 percent better and she feels like she needs to continue on something before she is out of the current meds and she's not coughing as much but her throat and voice are still not healed all the way, she has not had any fever but just wants to inform Dr she is coughing at night but not during the day but her chest still feels real heavy and as congested anymore (per the conversation in the appt could the Dr prescribe something more antibiotics or other) Patient num [phone] (M)

## 2023-09-22 NOTE — Addendum Note (Signed)
 Addended by: Raejean Bullock on: 09/22/2023 12:30 PM   Modules accepted: Orders

## 2023-10-09 DIAGNOSIS — H02839 Dermatochalasis of unspecified eye, unspecified eyelid: Secondary | ICD-10-CM | POA: Diagnosis not present

## 2023-10-09 DIAGNOSIS — H02403 Unspecified ptosis of bilateral eyelids: Secondary | ICD-10-CM | POA: Diagnosis not present

## 2023-10-09 DIAGNOSIS — L989 Disorder of the skin and subcutaneous tissue, unspecified: Secondary | ICD-10-CM | POA: Diagnosis not present

## 2023-10-09 DIAGNOSIS — H5340 Unspecified visual field defects: Secondary | ICD-10-CM | POA: Diagnosis not present

## 2023-10-09 DIAGNOSIS — H029 Unspecified disorder of eyelid: Secondary | ICD-10-CM | POA: Diagnosis not present

## 2023-11-01 DIAGNOSIS — D225 Melanocytic nevi of trunk: Secondary | ICD-10-CM | POA: Diagnosis not present

## 2023-11-01 DIAGNOSIS — D2272 Melanocytic nevi of left lower limb, including hip: Secondary | ICD-10-CM | POA: Diagnosis not present

## 2023-11-01 DIAGNOSIS — L570 Actinic keratosis: Secondary | ICD-10-CM | POA: Diagnosis not present

## 2023-11-01 DIAGNOSIS — D1801 Hemangioma of skin and subcutaneous tissue: Secondary | ICD-10-CM | POA: Diagnosis not present

## 2023-11-01 DIAGNOSIS — L723 Sebaceous cyst: Secondary | ICD-10-CM | POA: Diagnosis not present

## 2023-11-01 DIAGNOSIS — L814 Other melanin hyperpigmentation: Secondary | ICD-10-CM | POA: Diagnosis not present

## 2023-11-01 DIAGNOSIS — D2262 Melanocytic nevi of left upper limb, including shoulder: Secondary | ICD-10-CM | POA: Diagnosis not present

## 2023-11-01 DIAGNOSIS — D485 Neoplasm of uncertain behavior of skin: Secondary | ICD-10-CM | POA: Diagnosis not present

## 2023-11-01 DIAGNOSIS — L821 Other seborrheic keratosis: Secondary | ICD-10-CM | POA: Diagnosis not present

## 2023-11-01 DIAGNOSIS — D2261 Melanocytic nevi of right upper limb, including shoulder: Secondary | ICD-10-CM | POA: Diagnosis not present

## 2023-11-01 DIAGNOSIS — D2271 Melanocytic nevi of right lower limb, including hip: Secondary | ICD-10-CM | POA: Diagnosis not present

## 2023-11-07 DIAGNOSIS — R82998 Other abnormal findings in urine: Secondary | ICD-10-CM | POA: Diagnosis not present

## 2023-11-07 DIAGNOSIS — R3911 Hesitancy of micturition: Secondary | ICD-10-CM | POA: Diagnosis not present

## 2023-12-28 ENCOUNTER — Other Ambulatory Visit: Payer: Self-pay | Admitting: Internal Medicine

## 2023-12-28 DIAGNOSIS — R339 Retention of urine, unspecified: Secondary | ICD-10-CM

## 2023-12-28 DIAGNOSIS — E876 Hypokalemia: Secondary | ICD-10-CM

## 2023-12-28 DIAGNOSIS — E7800 Pure hypercholesterolemia, unspecified: Secondary | ICD-10-CM

## 2023-12-28 DIAGNOSIS — R739 Hyperglycemia, unspecified: Secondary | ICD-10-CM

## 2023-12-30 ENCOUNTER — Other Ambulatory Visit: Payer: Self-pay | Admitting: Internal Medicine

## 2024-01-02 NOTE — Telephone Encounter (Signed)
 Please call and see if fhe has enough medication to get her through to her appt. She has not had labs in over one year and needs labs.

## 2024-01-08 ENCOUNTER — Ambulatory Visit: Admitting: Internal Medicine

## 2024-01-08 ENCOUNTER — Ambulatory Visit: Payer: Self-pay | Admitting: Internal Medicine

## 2024-01-08 ENCOUNTER — Encounter: Payer: Self-pay | Admitting: Internal Medicine

## 2024-01-08 VITALS — BP 110/68 | HR 81 | Resp 16 | Ht 60.0 in | Wt 132.0 lb

## 2024-01-08 DIAGNOSIS — Z87442 Personal history of urinary calculi: Secondary | ICD-10-CM | POA: Diagnosis not present

## 2024-01-08 DIAGNOSIS — F439 Reaction to severe stress, unspecified: Secondary | ICD-10-CM

## 2024-01-08 DIAGNOSIS — R739 Hyperglycemia, unspecified: Secondary | ICD-10-CM

## 2024-01-08 DIAGNOSIS — N1831 Chronic kidney disease, stage 3a: Secondary | ICD-10-CM | POA: Diagnosis not present

## 2024-01-08 DIAGNOSIS — E876 Hypokalemia: Secondary | ICD-10-CM | POA: Diagnosis not present

## 2024-01-08 DIAGNOSIS — Z0000 Encounter for general adult medical examination without abnormal findings: Secondary | ICD-10-CM

## 2024-01-08 DIAGNOSIS — N200 Calculus of kidney: Secondary | ICD-10-CM

## 2024-01-08 DIAGNOSIS — K219 Gastro-esophageal reflux disease without esophagitis: Secondary | ICD-10-CM | POA: Diagnosis not present

## 2024-01-08 DIAGNOSIS — Z860100 Personal history of colon polyps, unspecified: Secondary | ICD-10-CM | POA: Diagnosis not present

## 2024-01-08 DIAGNOSIS — E7800 Pure hypercholesterolemia, unspecified: Secondary | ICD-10-CM

## 2024-01-08 DIAGNOSIS — G4730 Sleep apnea, unspecified: Secondary | ICD-10-CM | POA: Diagnosis not present

## 2024-01-08 LAB — BASIC METABOLIC PANEL WITH GFR
BUN: 15 mg/dL (ref 6–23)
CO2: 31 meq/L (ref 19–32)
Calcium: 9.7 mg/dL (ref 8.4–10.5)
Chloride: 101 meq/L (ref 96–112)
Creatinine, Ser: 0.93 mg/dL (ref 0.40–1.20)
GFR: 63.81 mL/min (ref >60.00)
Glucose, Bld: 95 mg/dL (ref 70–99)
Potassium: 3.8 meq/L (ref 3.5–5.1)
Sodium: 140 meq/L (ref 135–145)

## 2024-01-08 LAB — LIPID PANEL
Cholesterol: 270 mg/dL — ABNORMAL HIGH (ref 0–200)
HDL: 67.9 mg/dL (ref >39.00)
LDL Cholesterol: 184 mg/dL — ABNORMAL HIGH (ref 0–99)
NonHDL: 201.74
Total CHOL/HDL Ratio: 4
Triglycerides: 90 mg/dL (ref 0.0–149.0)
VLDL: 18 mg/dL (ref 0.0–40.0)

## 2024-01-08 LAB — HEPATIC FUNCTION PANEL
ALT: 13 U/L (ref 0–35)
AST: 16 U/L (ref 0–37)
Albumin: 4.7 g/dL (ref 3.5–5.2)
Alkaline Phosphatase: 56 U/L (ref 39–117)
Bilirubin, Direct: 0.1 mg/dL (ref 0.0–0.3)
Total Bilirubin: 0.6 mg/dL (ref 0.2–1.2)
Total Protein: 7 g/dL (ref 6.0–8.3)

## 2024-01-08 LAB — CBC WITH DIFFERENTIAL/PLATELET
Basophils Absolute: 0 K/uL (ref 0.0–0.1)
Basophils Relative: 1 % (ref 0.0–3.0)
Eosinophils Absolute: 0.1 K/uL (ref 0.0–0.7)
Eosinophils Relative: 2.3 % (ref 0.0–5.0)
HCT: 42.9 % (ref 36.0–46.0)
Hemoglobin: 15 g/dL (ref 12.0–15.0)
Lymphocytes Relative: 33.2 % (ref 12.0–46.0)
Lymphs Abs: 1.3 K/uL (ref 0.7–4.0)
MCHC: 34.9 g/dL (ref 30.0–36.0)
MCV: 86.2 fl (ref 78.0–100.0)
Monocytes Absolute: 0.3 K/uL (ref 0.1–1.0)
Monocytes Relative: 7.1 % (ref 3.0–12.0)
Neutro Abs: 2.3 K/uL (ref 1.4–7.7)
Neutrophils Relative %: 56.4 % (ref 43.0–77.0)
Platelets: 249 K/uL (ref 150.0–400.0)
RBC: 4.98 Mil/uL (ref 3.87–5.11)
RDW: 13 % (ref 11.5–15.5)
WBC: 4.1 K/uL (ref 4.0–10.5)

## 2024-01-08 LAB — HEMOGLOBIN A1C: Hgb A1c MFr Bld: 5.9 % (ref 4.6–6.5)

## 2024-01-08 LAB — TSH: TSH: 3.09 u[IU]/mL (ref 0.35–5.50)

## 2024-01-08 NOTE — Assessment & Plan Note (Addendum)
 Physical today 01/08/24.  S/p hysterectomy.  Mammogram overdue.  Discussed. Wants to hold on scheduling at this time. Colonoscopy 01/2022 - diverticulosis and internal hemorrhoids. Recommended f/u in 7 years.

## 2024-01-08 NOTE — Assessment & Plan Note (Signed)
 Followed by urology.

## 2024-01-08 NOTE — Assessment & Plan Note (Signed)
 Low-carb diet and exercise.  Follow met b and A1c.

## 2024-01-08 NOTE — Assessment & Plan Note (Signed)
 F/u 10/2023 - Dr Gretel Bordon - KUB - no stone formation. Continue hydrochlorothiazide  and polycitra. Also continue tamsulosin .

## 2024-01-08 NOTE — Assessment & Plan Note (Signed)
Saw GI.  Recommended protonix and carafate.  Upper symptoms controlled. Follow.

## 2024-01-08 NOTE — Assessment & Plan Note (Signed)
Colonoscopy 01/26/22 - mild diverticulosis of the sigmoid colon, internal hemorrhoids, mild rectal prolapse. Recommended f/u colonoscopy in 7 years.   

## 2024-01-08 NOTE — Assessment & Plan Note (Signed)
Increased stress.  Overall appears to be handling things relatively well.  Follow.   

## 2024-01-08 NOTE — Assessment & Plan Note (Signed)
 CPAP.
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
## 2024-01-08 NOTE — Assessment & Plan Note (Signed)
 Has had an issue with low potassium. Recheck today to confirm wnl.

## 2024-01-08 NOTE — Assessment & Plan Note (Signed)
 Has been followed by Dr Jaclynn.  Recommend to try and avoid antiinflammatories, or limit amount taking. Discussed celebrex . Stay hydrated. Check metabolic panel today.

## 2024-01-08 NOTE — Assessment & Plan Note (Signed)
 Off crestor . Stopped taking.  Low cholesterol diet and exercise.  Follow lipid panel and liver function tests.   Lab Results  Component Value Date   CHOL 270 (H) 01/08/2024   HDL 67.90 01/08/2024   LDLCALC 184 (H) 01/08/2024   LDLDIRECT 85.0 01/04/2019   TRIG 90.0 01/08/2024   CHOLHDL 4 01/08/2024

## 2024-01-10 MED ORDER — CELECOXIB 200 MG PO CAPS: 200.0000 mg | ORAL_CAPSULE | Freq: Every day | ORAL | 0 refills | Status: DC

## 2024-01-10 NOTE — Telephone Encounter (Signed)
 Rx sent in for celebrex . Pt was informed during visit to hold on taking ibuprofen with celebrex . May use tylenol .

## 2024-01-15 ENCOUNTER — Telehealth: Payer: Self-pay | Admitting: Internal Medicine

## 2024-01-15 NOTE — Telephone Encounter (Signed)
 Received notification that she is overdue mammogram. Need to scheduled.

## 2024-01-15 NOTE — Telephone Encounter (Signed)
 Patient declined mammogram at her appointment. Wants to wait until after the first of the year.

## 2024-03-31 ENCOUNTER — Other Ambulatory Visit: Payer: Self-pay | Admitting: Internal Medicine

## 2024-07-10 ENCOUNTER — Ambulatory Visit: Admitting: Internal Medicine
